# Patient Record
Sex: Female | Born: 1950 | Hispanic: No | Marital: Married | Smoking: Former smoker
Health system: Southern US, Community
[De-identification: ages and names within clinical notes are randomized; demographics above are authoritative.]

## PROBLEM LIST (undated history)

## (undated) DIAGNOSIS — K529 Noninfective gastroenteritis and colitis, unspecified: Secondary | ICD-10-CM

## (undated) DIAGNOSIS — Z8711 Personal history of peptic ulcer disease: Secondary | ICD-10-CM

## (undated) DIAGNOSIS — E538 Deficiency of other specified B group vitamins: Secondary | ICD-10-CM

## (undated) DIAGNOSIS — F329 Major depressive disorder, single episode, unspecified: Secondary | ICD-10-CM

## (undated) DIAGNOSIS — F3289 Other specified depressive episodes: Secondary | ICD-10-CM

## (undated) DIAGNOSIS — K5090 Crohn's disease, unspecified, without complications: Secondary | ICD-10-CM

## (undated) HISTORY — PX: MYOMECTOMY: SHX85

## (undated) HISTORY — PX: LASIK: SHX215

## (undated) HISTORY — DX: Deficiency of other specified B group vitamins: E53.8

## (undated) HISTORY — DX: Personal history of peptic ulcer disease: Z87.11

## (undated) HISTORY — DX: Other specified depressive episodes: F32.89

## (undated) HISTORY — DX: Major depressive disorder, single episode, unspecified: F32.9

## (undated) HISTORY — DX: Noninfective gastroenteritis and colitis, unspecified: K52.9

## (undated) HISTORY — DX: Crohn's disease, unspecified, without complications: K50.90

## (undated) HISTORY — PX: COLONOSCOPY: SHX174

## (undated) HISTORY — PX: ABDOMINAL HYSTERECTOMY: SHX81

## (undated) HISTORY — PX: RETINAL DETACHMENT SURGERY: SHX105

---

## 2008-08-30 ENCOUNTER — Ambulatory Visit: Payer: Self-pay | Admitting: Gastroenterology

## 2008-09-11 ENCOUNTER — Encounter: Payer: Self-pay | Admitting: Gastroenterology

## 2008-09-11 ENCOUNTER — Ambulatory Visit: Payer: Self-pay | Admitting: Gastroenterology

## 2008-09-13 ENCOUNTER — Encounter: Payer: Self-pay | Admitting: Gastroenterology

## 2008-09-14 ENCOUNTER — Ambulatory Visit: Payer: Self-pay | Admitting: Gastroenterology

## 2008-09-14 DIAGNOSIS — F329 Major depressive disorder, single episode, unspecified: Secondary | ICD-10-CM

## 2008-09-14 DIAGNOSIS — Z8711 Personal history of peptic ulcer disease: Secondary | ICD-10-CM | POA: Insufficient documentation

## 2008-09-20 ENCOUNTER — Ambulatory Visit: Payer: Self-pay | Admitting: Gastroenterology

## 2008-09-20 ENCOUNTER — Telehealth: Payer: Self-pay | Admitting: Gastroenterology

## 2008-09-20 DIAGNOSIS — E538 Deficiency of other specified B group vitamins: Secondary | ICD-10-CM | POA: Insufficient documentation

## 2008-09-20 LAB — CONVERTED CEMR LAB
AST: 18 units/L (ref 0–37)
BUN: 13 mg/dL (ref 6–23)
Basophils Relative: 0.1 % (ref 0.0–3.0)
Creatinine, Ser: 0.7 mg/dL (ref 0.4–1.2)
Eosinophils Absolute: 0.2 10*3/uL (ref 0.0–0.7)
Ferritin: 74.8 ng/mL (ref 10.0–291.0)
Folate: 8.8 ng/mL
GFR calc non Af Amer: 91.68 mL/min (ref >60)
Iron: 147 ug/dL — ABNORMAL HIGH (ref 42–145)
MCHC: 34.3 g/dL (ref 30.0–36.0)
MCV: 98 fL (ref 78.0–100.0)
Monocytes Absolute: 0.4 10*3/uL (ref 0.1–1.0)
Neutrophils Relative %: 71 % (ref 43.0–77.0)
Platelets: 206 10*3/uL (ref 150.0–400.0)
Potassium: 4.1 meq/L (ref 3.5–5.1)
RBC: 4.41 M/uL (ref 3.87–5.11)
TSH: 1.58 microintl units/mL (ref 0.35–5.50)
Total Bilirubin: 0.9 mg/dL (ref 0.3–1.2)
Vitamin B-12: 242 pg/mL (ref 211–911)

## 2008-09-28 ENCOUNTER — Ambulatory Visit: Payer: Self-pay | Admitting: Gastroenterology

## 2008-09-28 DIAGNOSIS — F17200 Nicotine dependence, unspecified, uncomplicated: Secondary | ICD-10-CM

## 2008-09-29 ENCOUNTER — Telehealth: Payer: Self-pay | Admitting: Gastroenterology

## 2008-10-05 ENCOUNTER — Ambulatory Visit: Payer: Self-pay | Admitting: Gastroenterology

## 2008-10-09 ENCOUNTER — Telehealth: Payer: Self-pay | Admitting: Gastroenterology

## 2008-11-16 ENCOUNTER — Telehealth: Payer: Self-pay | Admitting: Gastroenterology

## 2009-01-02 ENCOUNTER — Ambulatory Visit: Payer: Self-pay | Admitting: Gastroenterology

## 2009-01-31 ENCOUNTER — Encounter: Payer: Self-pay | Admitting: Gastroenterology

## 2009-02-14 ENCOUNTER — Encounter (INDEPENDENT_AMBULATORY_CARE_PROVIDER_SITE_OTHER): Payer: Self-pay

## 2009-04-13 ENCOUNTER — Ambulatory Visit: Payer: Self-pay | Admitting: Gastroenterology

## 2009-07-06 ENCOUNTER — Telehealth: Payer: Self-pay | Admitting: Gastroenterology

## 2009-07-24 ENCOUNTER — Encounter: Payer: Self-pay | Admitting: Gastroenterology

## 2009-08-02 ENCOUNTER — Encounter (INDEPENDENT_AMBULATORY_CARE_PROVIDER_SITE_OTHER): Payer: Self-pay | Admitting: *Deleted

## 2010-06-25 NOTE — Progress Notes (Signed)
Summary: Nascobal discontinued  Phone Note Call from Patient Call back at [facility] Phone [phone]   Call For: Dr Jarold Motto Summary of Call: Nascobal B12 nasal spray has been discontinued. [facility] on [address] told her they have been calling us and we do not reply. Initial call taken by: Leanor Kail [facility],  July 06, 2009 12:21 PM  Follow-up for Phone Call        Sample given. Lupita Leash Surface RN  July 06, 2009 2:17 PM  Checked with drug rep for nascobal and med has not been discontinued.  Spoke with pharmacist at [facility], they will try ordering med again.  Pt notified. Follow-up by: Ashok Cordia RN,  July 06, 2009 3:55 PM

## 2010-06-25 NOTE — Letter (Signed)
Summary: Colonoscopy Letter  [facility]  [address] [place], [place] [address]   Phone: [phone]  Fax: [phone]      August 02, 2009 MRN: [id number]   [facility] [address] [place], [place]  [address]   Dear Ms. Rupert,   According to your medical record, it is time for you to schedule a Colonoscopy. The American Cancer Society recommends this procedure as a method to detect early colon cancer. Patients with a family history of colon cancer, or a personal history of colon polyps or inflammatory bowel disease are at increased risk.  This letter has beeen generated based on the recommendations made at the time of your procedure. If you feel that in your particular situation this may no longer apply, please contact our office.  Please call our office at [phone] to schedule this appointment or to update your records at your earliest convenience.  Thank you for cooperating with [place] to provide you with the very best care possible.   Sincerely,   Vania Rea. Jarold Motto, M.D.  [facility] [phone]

## 2010-06-25 NOTE — Procedures (Signed)
Summary: Recall Assessment/[facility]  Recall Assessment/[facility]   Imported By: Sherian Rein 01/08/2010 10:10:16  _____________________________________________________________________  External Attachment:    Type:   Image     Comment:   External Document

## 2010-08-25 ENCOUNTER — Other Ambulatory Visit: Payer: Self-pay | Admitting: Gastroenterology

## 2010-09-04 ENCOUNTER — Other Ambulatory Visit: Payer: Self-pay | Admitting: Gastroenterology

## 2010-09-06 ENCOUNTER — Other Ambulatory Visit: Payer: Self-pay | Admitting: Gastroenterology

## 2010-09-09 ENCOUNTER — Telehealth: Payer: Self-pay | Admitting: Gastroenterology

## 2010-09-09 NOTE — Telephone Encounter (Signed)
Pt with Hx of Crohn's, last COLON 4/19/201 with findings of: FINDINGS:  There were mucosal changes consistent with Crohn's disease. in the mid transverse colon. SEVERE ULCERATION IN CECUM AND TV COLON.THE ASCENDING COLON HAS SCARRED ATROPHIC MUCOSA. LARGE GROUPS OF FUNGATING PSEUDOPOLYPS IN TV COLON. THERE IS MILD FOCAL SIGMOID DISEASE.I COULD NOT ENTER THE ILEUM VERY FAR PER INFLAMMATION IN THIS AREA.  There were mucosal changes consistent with Crohn's disease. in the sigmoid colon.  There were mucosal changes consistent with Crohn's disease. in the ascending colon.  There were mucosal changes consistent with Crohn's disease. in the cecum.   Retroflexed views in the rectum revealed no abnormalities.    The scope was then withdrawn from the patient and the procedure completed.  Last OV 06/13/2008. Recall letter sent 07/24/2009 and meds were refilled. Pt asked for refill on Wellbutrin and B12 nasal spray on [place] denied them. Pt called  back today stating she never has any problems, has a normal stool everyday and is asymptomatic. She stated if she has a COLON, the prep messes her up for more than 2 weeks.  Pt wants to know if she has to have a COLON now or can she wait until the winter?

## 2010-09-09 NOTE — Telephone Encounter (Signed)
NEEDS PV

## 2010-09-13 ENCOUNTER — Telehealth: Payer: Self-pay | Admitting: Gastroenterology

## 2010-09-13 MED ORDER — CYANOCOBALAMIN 500 MCG/0.1ML NA SOLN: NASAL | Status: DC

## 2010-09-13 MED ORDER — BUPROPION HCL ER (XL) 150 MG PO TB24: 150.0000 mg | ORAL_TABLET | ORAL | Status: DC

## 2010-09-13 MED ORDER — SULFASALAZINE 500 MG PO TABS: 500.0000 mg | ORAL_TABLET | Freq: Every day | ORAL | Status: DC

## 2010-09-13 NOTE — Telephone Encounter (Signed)
Patient made appt for colonoscopy and I explained that she must have colonoscopy to have futher refills. I have refilled her meds for one month if she does not keep the colonoscopy she will no longer get refills.

## 2010-09-24 ENCOUNTER — Ambulatory Visit (AMBULATORY_SURGERY_CENTER): Payer: Managed Care, Other (non HMO) | Admitting: *Deleted

## 2010-09-24 VITALS — Ht 64.0 in | Wt 130.0 lb

## 2010-09-24 DIAGNOSIS — Z1211 Encounter for screening for malignant neoplasm of colon: Secondary | ICD-10-CM

## 2010-09-24 MED ORDER — PEG-KCL-NACL-NASULF-NA ASC-C 100 G PO SOLR: ORAL | Status: DC

## 2010-09-30 ENCOUNTER — Other Ambulatory Visit: Payer: Self-pay | Admitting: Gastroenterology

## 2010-10-01 ENCOUNTER — Encounter: Payer: Self-pay | Admitting: Gastroenterology

## 2010-10-02 ENCOUNTER — Ambulatory Visit (AMBULATORY_SURGERY_CENTER): Payer: Managed Care, Other (non HMO) | Admitting: Gastroenterology

## 2010-10-02 ENCOUNTER — Encounter: Payer: Self-pay | Admitting: Gastroenterology

## 2010-10-02 VITALS — BP 152/81 | HR 68 | Temp 97.2°F | Resp 16 | Ht 64.0 in | Wt 130.0 lb

## 2010-10-02 DIAGNOSIS — K5010 Crohn's disease of large intestine without complications: Secondary | ICD-10-CM

## 2010-10-02 DIAGNOSIS — D126 Benign neoplasm of colon, unspecified: Secondary | ICD-10-CM

## 2010-10-02 DIAGNOSIS — Z1211 Encounter for screening for malignant neoplasm of colon: Secondary | ICD-10-CM

## 2010-10-02 DIAGNOSIS — K5090 Crohn's disease, unspecified, without complications: Secondary | ICD-10-CM

## 2010-10-02 MED ORDER — SODIUM CHLORIDE 0.9 % IV SOLN: 500.0000 mL | INTRAVENOUS | Status: DC

## 2010-10-02 MED ORDER — SULFASALAZINE 500 MG PO TABS: 1000.0000 mg | ORAL_TABLET | Freq: Two times a day (BID) | ORAL | Status: DC

## 2010-10-02 NOTE — Patient Instructions (Signed)
Read over discharge instructions. Colonoscopy shower colitis,pseudopolyps,& anal papillae. Biopsies taken, you will receive result letter in your mail in about 1-2 weeks. Repeat colonoscopy in 3 years. Resume current medications, [facility] and pick [facility] new prescriptions for Aza take 1gm twice a day.

## 2010-10-03 ENCOUNTER — Other Ambulatory Visit: Payer: Self-pay | Admitting: Gastroenterology

## 2010-10-03 ENCOUNTER — Telehealth: Payer: Self-pay | Admitting: *Deleted

## 2010-10-03 NOTE — Telephone Encounter (Signed)
Left message

## 2010-10-08 ENCOUNTER — Ambulatory Visit (INDEPENDENT_AMBULATORY_CARE_PROVIDER_SITE_OTHER): Payer: Managed Care, Other (non HMO) | Admitting: Gastroenterology

## 2010-10-08 ENCOUNTER — Encounter: Payer: Self-pay | Admitting: Gastroenterology

## 2010-10-08 ENCOUNTER — Other Ambulatory Visit (INDEPENDENT_AMBULATORY_CARE_PROVIDER_SITE_OTHER): Payer: Managed Care, Other (non HMO)

## 2010-10-08 VITALS — BP 144/92 | HR 84 | Ht 64.0 in | Wt 128.0 lb

## 2010-10-08 DIAGNOSIS — K529 Noninfective gastroenteritis and colitis, unspecified: Secondary | ICD-10-CM

## 2010-10-08 DIAGNOSIS — K5289 Other specified noninfective gastroenteritis and colitis: Secondary | ICD-10-CM

## 2010-10-08 DIAGNOSIS — K5010 Crohn's disease of large intestine without complications: Secondary | ICD-10-CM

## 2010-10-08 LAB — HEPATIC FUNCTION PANEL
ALT: 21 U/L (ref 0–35)
AST: 18 U/L (ref 0–37)
Albumin: 4.1 g/dL (ref 3.5–5.2)
Alkaline Phosphatase: 62 U/L (ref 39–117)
Bilirubin, Direct: 0.1 mg/dL (ref 0.0–0.3)
Total Protein: 6.9 g/dL (ref 6.0–8.3)

## 2010-10-08 LAB — BASIC METABOLIC PANEL
BUN: 19 mg/dL (ref 6–23)
CO2: 27 mEq/L (ref 19–32)
Chloride: 102 mEq/L (ref 96–112)
Glucose, Bld: 76 mg/dL (ref 70–99)
Potassium: 5.1 mEq/L (ref 3.5–5.1)

## 2010-10-08 LAB — CBC WITH DIFFERENTIAL/PLATELET
Basophils Absolute: 0 10*3/uL (ref 0.0–0.1)
Eosinophils Absolute: 0.3 10*3/uL (ref 0.0–0.7)
Lymphocytes Relative: 23.5 % (ref 12.0–46.0)
MCHC: 34.2 g/dL (ref 30.0–36.0)
Monocytes Relative: 8.7 % (ref 3.0–12.0)
Platelets: 228 10*3/uL (ref 150.0–400.0)
RDW: 13.7 % (ref 11.5–14.6)

## 2010-10-08 LAB — IBC PANEL
Saturation Ratios: 41.5 % (ref 20.0–50.0)
Transferrin: 252.8 mg/dL (ref 212.0–360.0)

## 2010-10-08 LAB — FOLATE: Folate: >24.9 ng/mL (ref >5.9)

## 2010-10-08 LAB — SEDIMENTATION RATE: Sed Rate: 10 mm/hr (ref 0–22)

## 2010-10-08 MED ORDER — CENTRUM SILVER PO TABS: 1.0000 | ORAL_TABLET | Freq: Every day | ORAL | Status: DC

## 2010-10-08 MED ORDER — CALCIUM CARBONATE-VITAMIN D 600-400 MG-UNIT PO TABS: 1.0000 | ORAL_TABLET | Freq: Two times a day (BID) | ORAL | Status: DC

## 2010-10-08 NOTE — Patient Instructions (Signed)
Buy Caltrate and Centrum Silver and take as directed on your medication list.  Please go to the basement today for your labs.

## 2010-10-08 NOTE — Progress Notes (Signed)
History of Present Illness: This is a 60 year old patient female with Crohn's colitis who recently underwent dysplasia screening. Marked colonic pseudopolyposis with scattered erosions. Biopsy showed no evidence of dysplasia. She denies any GI or general medical symptoms. Her medications have included Azulfidine 1 g twice a day for several years. He is not had recent laboratory testing. She is Wellbutrin 50 mg a day. She denies a current psychiatric difficulties.    Current Medications, Allergies, Past Medical History, Past Surgical History, Family History and Social History were reviewed in [employer] record.  PE: Awake and alert no acute distress. She does have some scattered spider telangiectasias on her anterior thorax. Chest is clear cardiac exam is unremarkable. I cannot appreciate hepatosplenomegaly, abdominal masses or tenderness. Bowel sounds are normal. There no gross focal neurological deficits.  Assessment and plan: Chronic Crohn's colitis rarely stable on Azulfidine 1 g twice a day with folic acid supplementation. I'm concerned that she may have occult liver disease and have scheduled screening labs for review. I've suggested she take Centrum Silver ultra vitamins, continue folic acid, and also take Caltrate 600 with vitamin D 2 tablets a day. She will need screening colonoscopy every 3 years depending on her clinical course. Encounter Diagnosis  Name Primary?  . Colitis Yes

## 2010-10-09 ENCOUNTER — Encounter: Payer: Self-pay | Admitting: Gastroenterology

## 2010-11-02 ENCOUNTER — Other Ambulatory Visit: Payer: Self-pay | Admitting: Gastroenterology

## 2010-11-10 ENCOUNTER — Other Ambulatory Visit: Payer: Self-pay | Admitting: Gastroenterology

## 2010-11-19 ENCOUNTER — Other Ambulatory Visit: Payer: Self-pay | Admitting: Gastroenterology

## 2010-12-09 ENCOUNTER — Other Ambulatory Visit: Payer: Self-pay | Admitting: Gastroenterology

## 2010-12-11 ENCOUNTER — Other Ambulatory Visit: Payer: Self-pay | Admitting: Gastroenterology

## 2010-12-29 ENCOUNTER — Other Ambulatory Visit: Payer: Self-pay | Admitting: Gastroenterology

## 2011-02-11 ENCOUNTER — Telehealth: Payer: Self-pay | Admitting: Gastroenterology

## 2011-02-11 NOTE — Telephone Encounter (Signed)
Coupon mailed to pt and if it will not take enough off She will call back to schedule b12 injections monthly.

## 2011-05-05 ENCOUNTER — Encounter (HOSPITAL_COMMUNITY): Payer: Self-pay | Admitting: *Deleted

## 2011-05-05 ENCOUNTER — Emergency Department (HOSPITAL_COMMUNITY): Payer: BC Managed Care – PPO

## 2011-05-05 ENCOUNTER — Emergency Department (HOSPITAL_COMMUNITY)
Admission: EM | Admit: 2011-05-05 | Discharge: 2011-05-05 | Disposition: A | Discharge status: Home / Self Care | Payer: BC Managed Care – PPO | Attending: Emergency Medicine | Admitting: Emergency Medicine

## 2011-05-05 DIAGNOSIS — S93409A Sprain of unspecified ligament of unspecified ankle, initial encounter: Secondary | ICD-10-CM | POA: Insufficient documentation

## 2011-05-05 DIAGNOSIS — X58XXXA Exposure to other specified factors, initial encounter: Secondary | ICD-10-CM | POA: Insufficient documentation

## 2011-05-05 DIAGNOSIS — M25579 Pain in unspecified ankle and joints of unspecified foot: Secondary | ICD-10-CM | POA: Insufficient documentation

## 2011-05-05 MED ORDER — OXYCODONE-ACETAMINOPHEN 5-325 MG PO TABS
1.0000 | ORAL_TABLET | Freq: Once | ORAL | Status: AC
Start: 2011-05-05 — End: 2011-05-05
  Administered 2011-05-05: 1 via ORAL
  Filled 2011-05-05: qty 1

## 2011-05-05 MED ORDER — OXYCODONE-ACETAMINOPHEN 5-325 MG PO TABS: 1.0000 | ORAL_TABLET | Freq: Once | ORAL | Status: AC

## 2011-05-05 NOTE — ED Notes (Signed)
Patient transported to X-ray 

## 2011-05-05 NOTE — ED Provider Notes (Signed)
History     CSN: [id number] Arrival date & time: 05/05/2011 11:55 AM   First MD Initiated Contact with Patient 05/05/11 1303      Chief Complaint  Patient presents with  . Ankle Pain    pt does not recall exact injury. pt reports waking this am and not able to walk. pts left ankle is swollen and bruised.     (Consider location/radiation/quality/duration/timing/severity/associated sxs/prior treatment) HPI Comments: Patient presents with chief complaint of left foot and ankle pain when weightbearing.  She does not recall injuring her foot but states she might have rolled her ankle last night while walking downstairs.  Patient denies any other complaints.  Patient is a 60 y.o. female presenting with ankle pain. The history is provided by the patient.  Ankle Pain  The incident occurred yesterday. The incident occurred at home. The pain is present in the left foot and left ankle. The quality of the pain is described as aching. The pain is at a severity of 7/10. The pain is mild. Associated symptoms include inability to bear weight. Pertinent negatives include no numbness, no loss of motion, no muscle weakness, no loss of sensation and no tingling. She reports no foreign bodies present. The symptoms are aggravated by bearing weight and palpation. She has tried acetaminophen, ice and elevation for the symptoms. The treatment provided mild relief.    Past Medical History  Diagnosis Date  . Crohn disease   . Depressive disorder, not elsewhere classified   . Personal history of peptic ulcer disease   . Vitamin B12 deficiency   . Colitis     Past Surgical History  Procedure Date  . Myomectomy   . Cesarean section     x2  . Abdominal hysterectomy     Family History  Problem Relation Age of Onset  . Heart disease Father   . Colon cancer Neg Hx     History  Substance Use Topics  . Smoking status: Current Everyday Smoker -- 0.5 packs/day    Types: Cigarettes  . Smokeless tobacco:  Not on file  . Alcohol Use: No     2- 6 ounce glasses wine daily    OB History    Grav Para Term Preterm Abortions TAB SAB Ect Mult Living                  Review of Systems  Neurological: Negative for tingling and numbness.  All other systems reviewed and are negative.    Allergies  Erythromycin  Home Medications   Current Outpatient Rx  Name Route Sig Dispense Refill  . BUPROPION HCL ER (XL) 150 MG PO TB24 Oral Take 150 mg by mouth daily.     Marland Kitchen BUTALBITAL-APAP-CAFFEINE 50-325-40 MG PO TABS Oral Take 0.5 tablets by mouth 2 (two) times daily as needed. For headache     . CALCIUM CARBONATE-VITAMIN D 600-400 MG-UNIT PO TABS Oral Take 1 tablet by mouth daily.      . CYANOCOBALAMIN 500 MCG/0.1ML NA SOLN Nasal Place 500 mcg into the nose once a week.     Marland Kitchen PEPCID PO Oral Take 20 mg by mouth daily.     Marland Kitchen FEXOFENADINE HCL 60 MG PO TABS Oral Take 60 mg by mouth daily.      Marland Kitchen FLUOXETINE HCL 20 MG PO CAPS Oral Take 20 mg by mouth daily.      Marland Kitchen FOLIC ACID 1 MG PO TABS Oral Take 1 mg by mouth daily.     Marland Kitchen  GUAIFENESIN ER 600 MG PO TB12 Oral Take 600 mg by mouth daily.      . IBUPROFEN 200 MG PO TABS Oral Take 200 mg by mouth every 6 (six) hours as needed. For headache     . CENTRUM SILVER PO TABS Oral Take 1 tablet by mouth daily.     . SULFASALAZINE 500 MG PO TABS Oral Take 1,000 mg by mouth daily.     Marland Kitchen VARENICLINE TARTRATE 0.5 MG X 11 & 1 MG X 42 PO MISC Oral Take 1 mg by mouth 2 (two) times daily. Take one 0.5mg  tablet by mouth once daily for 3 days, then increase to one 0.5mg  tablet twice daily for 3 days, then increase to one 1mg  tablet twice daily.       BP 141/89  Pulse 82  Temp 98 F (36.7 C)  Resp 20  SpO2 97%  Physical Exam  Constitutional: She is oriented to person, place, and time. She appears well-developed and well-nourished. No distress.  HENT:  Head: Normocephalic and atraumatic.  Mouth/Throat: Oropharynx is clear and moist. No oropharyngeal exudate.  Eyes:  Conjunctivae and EOM are normal. Pupils are equal, round, and reactive to light. No scleral icterus.  Neck: Normal range of motion. Neck supple. No tracheal deviation present. No thyromegaly present.  Cardiovascular: Normal rate, regular rhythm, normal heart sounds and intact distal pulses.   Pulmonary/Chest: Effort normal and breath sounds normal. No stridor.  Abdominal: Soft. Bowel sounds are normal.  Musculoskeletal: She exhibits no edema and no tenderness.       Left ankle: She exhibits decreased range of motion and swelling. She exhibits no deformity, no laceration and normal pulse. tenderness. Lateral malleolus and medial malleolus tenderness found. No head of 5th metatarsal and no proximal fibula tenderness found.       Left foot: She exhibits tenderness, bony tenderness and swelling. She exhibits normal range of motion, normal capillary refill and no laceration.  Neurological: She is alert and oriented to person, place, and time. She has normal reflexes. Coordination normal.  Skin: Skin is warm and dry. No rash noted. She is not diaphoretic. No erythema. No pallor.  Psychiatric: She has a normal mood and affect. Her behavior is normal.    ED Course  Procedures (including critical care time)  Labs Reviewed - No data to display Dg Ankle Complete Left  05/05/2011  *RADIOLOGY REPORT*  Clinical Data: Left ankle injury.  LEFT ANKLE COMPLETE - 3+ VIEW  Comparison: None  Findings: The ankle mortise is maintained.  No acute ankle fracture.  There is moderate lateral soft tissue swelling.  IMPRESSION: No acute fracture.  Original Report Authenticated By: P. Loralie Champagne, M.D.   Dg Foot Complete Left  05/05/2011  *RADIOLOGY REPORT*  Clinical Data: Left foot pain.  LEFT FOOT - COMPLETE 3+ VIEW  Comparison: None  Findings: The joint spaces are maintained.  No acute fracture.  IMPRESSION: No acute bony findings.  Original Report Authenticated By: P. Loralie Champagne, M.D.     No diagnosis  found.    MDM  Ankle pain/swellin left        [place], [place] 05/05/11 1445

## 2011-05-05 NOTE — ED Notes (Signed)
Family at bedside. 

## 2011-05-05 NOTE — ED Notes (Signed)
Pt reports a "turned ankle" 2 days ago. Pt unable to place weight on it.

## 2011-05-06 NOTE — ED Provider Notes (Signed)
Medical screening examination/treatment/procedure(s) were conducted as a shared visit with non-physician practitioner(s) and myself.  I personally evaluated the patient during the encounter   Goedde Munch, MD 05/06/11 [phone]

## 2011-06-07 ENCOUNTER — Other Ambulatory Visit: Payer: Self-pay | Admitting: Gastroenterology

## 2011-07-02 ENCOUNTER — Other Ambulatory Visit: Payer: Self-pay | Admitting: Gastroenterology

## 2011-10-20 ENCOUNTER — Other Ambulatory Visit: Payer: Self-pay | Admitting: Gastroenterology

## 2011-12-06 ENCOUNTER — Other Ambulatory Visit: Payer: Self-pay | Admitting: Gastroenterology

## 2011-12-08 ENCOUNTER — Other Ambulatory Visit: Payer: Self-pay | Admitting: Gastroenterology

## 2011-12-08 MED ORDER — BUPROPION HCL ER (XL) 150 MG PO TB24: 150.0000 mg | ORAL_TABLET | Freq: Every day | ORAL | Status: DC

## 2011-12-22 ENCOUNTER — Other Ambulatory Visit: Payer: Self-pay | Admitting: Gastroenterology

## 2012-01-21 ENCOUNTER — Other Ambulatory Visit: Payer: Self-pay | Admitting: Gastroenterology

## 2012-02-28 ENCOUNTER — Other Ambulatory Visit: Payer: Self-pay | Admitting: Gastroenterology

## 2012-03-01 ENCOUNTER — Other Ambulatory Visit: Payer: Self-pay | Admitting: Gastroenterology

## 2012-04-13 ENCOUNTER — Other Ambulatory Visit: Payer: Self-pay | Admitting: Gastroenterology

## 2012-04-13 ENCOUNTER — Telehealth: Payer: Self-pay | Admitting: *Deleted

## 2012-04-13 NOTE — Telephone Encounter (Signed)
buPROPion (WELLBUTRIN XL) 150 MG 24 hr tablet [Pharmacy Med Name: BUPROPION XL 150MG  TABLETS (24 H)]  30 tablet  11  12/06/2011     Sig: TAKE 1 TABLET BY MOUTH EVERY MORNING   Class: Normal   Reason for Refusal: Patient needs an appointment   Refused By: Richardson Chiquito, CMA                  Visit Pharmacy    Tried to call patient and schedule a follow up visit but patients vm said that there is no space to leave message Denied refill, patient needs office visit for Crohns.  Patient last OV was 10-08-2010

## 2012-04-15 ENCOUNTER — Telehealth: Payer: Self-pay | Admitting: Gastroenterology

## 2012-04-16 ENCOUNTER — Other Ambulatory Visit: Payer: Self-pay | Admitting: *Deleted

## 2012-04-16 MED ORDER — BUPROPION HCL ER (XL) 150 MG PO TB24: 150.0000 mg | ORAL_TABLET | Freq: Every day | ORAL | Status: DC

## 2012-04-16 NOTE — Telephone Encounter (Signed)
I called patient left message on vm for patient to call me back.  Patient needs office visit for follow up

## 2012-04-16 NOTE — Telephone Encounter (Signed)
Patient called back and stated that she does not have enough Wellbutrin, only two days left and needs a refill.  I advised patient that she needs an office visit for further refills because she has IBD and Crohns and her last office visit was 09-2010.  Patient stated that she is on her way to [facility] and did not know that the RX was denied 3 times until this morning when she was leaving for [place].  Patient stated that she will be in [facility] until Dec 2. I advised patient that we need to schedule a follow up visit first and I will have to ask nursing supervisor if ok to send RX to cover her until follow up appointment.  Per Lavonna Rua, RN ok to give patient enough pills to last her until she comes in for a follow up office visit on Dec. 6.  Patient wanted RX called in to [facility]. On [address]  Advised patient that RX will be sent, enough will be sent to cover her until her follow up appointment on Dec 6 at 11 am and she will have to keep this appointment for further refills.   Patient verbalized understanding.

## 2012-04-30 ENCOUNTER — Ambulatory Visit (INDEPENDENT_AMBULATORY_CARE_PROVIDER_SITE_OTHER): Payer: BC Managed Care – PPO | Admitting: Gastroenterology

## 2012-04-30 ENCOUNTER — Encounter: Payer: Self-pay | Admitting: Gastroenterology

## 2012-04-30 VITALS — BP 110/76 | HR 80 | Ht 63.0 in | Wt 139.5 lb

## 2012-04-30 DIAGNOSIS — E538 Deficiency of other specified B group vitamins: Secondary | ICD-10-CM

## 2012-04-30 DIAGNOSIS — K5010 Crohn's disease of large intestine without complications: Secondary | ICD-10-CM

## 2012-04-30 MED ORDER — BUPROPION HCL ER (XL) 150 MG PO TB24: 150.0000 mg | ORAL_TABLET | Freq: Every day | ORAL | Status: DC

## 2012-04-30 MED ORDER — SULFASALAZINE 500 MG PO TABS: ORAL_TABLET | ORAL | Status: DC

## 2012-04-30 MED ORDER — CYANOCOBALAMIN 500 MCG/0.1ML NA SOLN: NASAL | Status: DC

## 2012-04-30 NOTE — Progress Notes (Signed)
This is a very pleasant 61 year old Caucasian female who has a long history of Crohn's disease of her colon.  Last colonoscopy in June of 2012 showed numerous colonic pseudopolyps, but biopsy showed no evidence of dysplasia.  She is on Azulfidine 1 g twice a day along with folic acid and multivitamins, and is currently asymptomatic.  With the help of Wellbutrin, she been able to discontinue her smoking habit.  Her appetite is good and her weight is stable.  She denies diarrhea, abdominal pain, rectal bleeding, upper GI, hepatobiliary, or systemic complaints.  Current Medications, Allergies, Past Medical History, Past Surgical History, Family History and Social History were reviewed in [employer] record.  Pertinent Review of Systems Negative   Physical Exam: Healthy patient in no acute distress.  Blood pressure 110/76, pulse 80 and regular, weight 139 pounds and BMI 24.71.  I cannot appreciate stigmata of chronic liver disease.  Chest is clear and she is in a regular rhythm without murmurs gallops or rubs.  Abdominal exam is entirely benign without organomegaly, masses or tenderness.  Bowel sounds are normal.  Rectal exam is deferred.  Peripheral extremities are unremarkable.    Assessment and Plan: Chronic Crohn's colitis doing well on standard aminosalicylate therapy.  She is to continue all of her medications as listed and reviewed.  We will do yearly followups with repeat labs in 1 year time.  She is on nasal B12 replacement therapy.  She receives colonoscopy with dysplasia screening every 3 years.  She is to call if she has recurrence or flare of her symptoms.  We will continue Wellbutrin therapy because were successful smoking cessation. No diagnosis found.

## 2012-04-30 NOTE — Patient Instructions (Addendum)
Please follow up in one year.  We have sent medications to your pharmacy for you to pick up at your convenience: Nascobal, Wellbutrin and Sulfasalazine. Please take as directed.  CC: Elpidio Anis, MD

## 2012-06-03 ENCOUNTER — Other Ambulatory Visit: Payer: Self-pay | Admitting: Gastroenterology

## 2012-08-01 ENCOUNTER — Other Ambulatory Visit: Payer: Self-pay | Admitting: Gastroenterology

## 2012-08-05 ENCOUNTER — Telehealth: Payer: Self-pay | Admitting: Gastroenterology

## 2012-08-05 DIAGNOSIS — Z9225 Personal history of immunosupression therapy: Secondary | ICD-10-CM

## 2012-08-05 NOTE — Telephone Encounter (Signed)
Patient is out of town.  She scheduled an appointment to see Dr. Jarold Motto 08/13/12 for blood in her stool.  She is wanting to know if it subsides, should she still keep the appointment.

## 2012-08-05 NOTE — Telephone Encounter (Signed)
Pt reports she is out of town and her bleeding has cleared up. Her appt was cancelled, but it's time for her labs so she will come in next week.

## 2012-08-05 NOTE — Telephone Encounter (Signed)
lmom for pt to call back or leave me a message. She does have Crohn's and needs yearly f/u's and labs per notes.

## 2012-08-12 ENCOUNTER — Other Ambulatory Visit (INDEPENDENT_AMBULATORY_CARE_PROVIDER_SITE_OTHER): Payer: BC Managed Care – PPO

## 2012-08-12 DIAGNOSIS — Z9225 Personal history of immunosupression therapy: Secondary | ICD-10-CM

## 2012-08-12 LAB — CBC WITH DIFFERENTIAL/PLATELET
Eosinophils Relative: 5.6 % — ABNORMAL HIGH (ref 0.0–5.0)
HCT: 40.2 % (ref 36.0–46.0)
Hemoglobin: 13.4 g/dL (ref 12.0–15.0)
Lymphs Abs: 1.5 10*3/uL (ref 0.7–4.0)
Monocytes Relative: 8.5 % (ref 3.0–12.0)
Neutro Abs: 2.9 10*3/uL (ref 1.4–7.7)
WBC: 5.1 10*3/uL (ref 4.5–10.5)

## 2012-08-12 LAB — BASIC METABOLIC PANEL
BUN: 21 mg/dL (ref 6–23)
Calcium: 9.1 mg/dL (ref 8.4–10.5)
Creatinine, Ser: 0.7 mg/dL (ref 0.4–1.2)
GFR: 86.18 mL/min (ref >60.00)
Potassium: 4.8 mEq/L (ref 3.5–5.1)

## 2012-08-12 LAB — TSH: TSH: 2.2 u[IU]/mL (ref 0.35–5.50)

## 2012-08-12 LAB — FOLATE: Folate: >24.8 ng/mL (ref >5.9)

## 2012-08-12 LAB — IBC PANEL: Saturation Ratios: 31.8 % (ref 20.0–50.0)

## 2012-08-12 LAB — SEDIMENTATION RATE: Sed Rate: 11 mm/hr (ref 0–22)

## 2012-08-12 LAB — HEPATIC FUNCTION PANEL: Albumin: 4.1 g/dL (ref 3.5–5.2)

## 2012-08-13 ENCOUNTER — Ambulatory Visit: Payer: BC Managed Care – PPO | Admitting: Gastroenterology

## 2012-08-19 ENCOUNTER — Telehealth: Payer: Self-pay | Admitting: Gastroenterology

## 2012-08-19 NOTE — Telephone Encounter (Signed)
Informed pt of lab results; mailed her a copy. Pt stated understanding.

## 2012-09-03 IMAGING — CR DG FOOT COMPLETE 3+V*L*
3 series · 3 of 3 positions shown · non-contrast
Comparison: None

CLINICAL DATA: Left foot pain.

LEFT FOOT - COMPLETE 3+ VIEW

[x foot ap left]
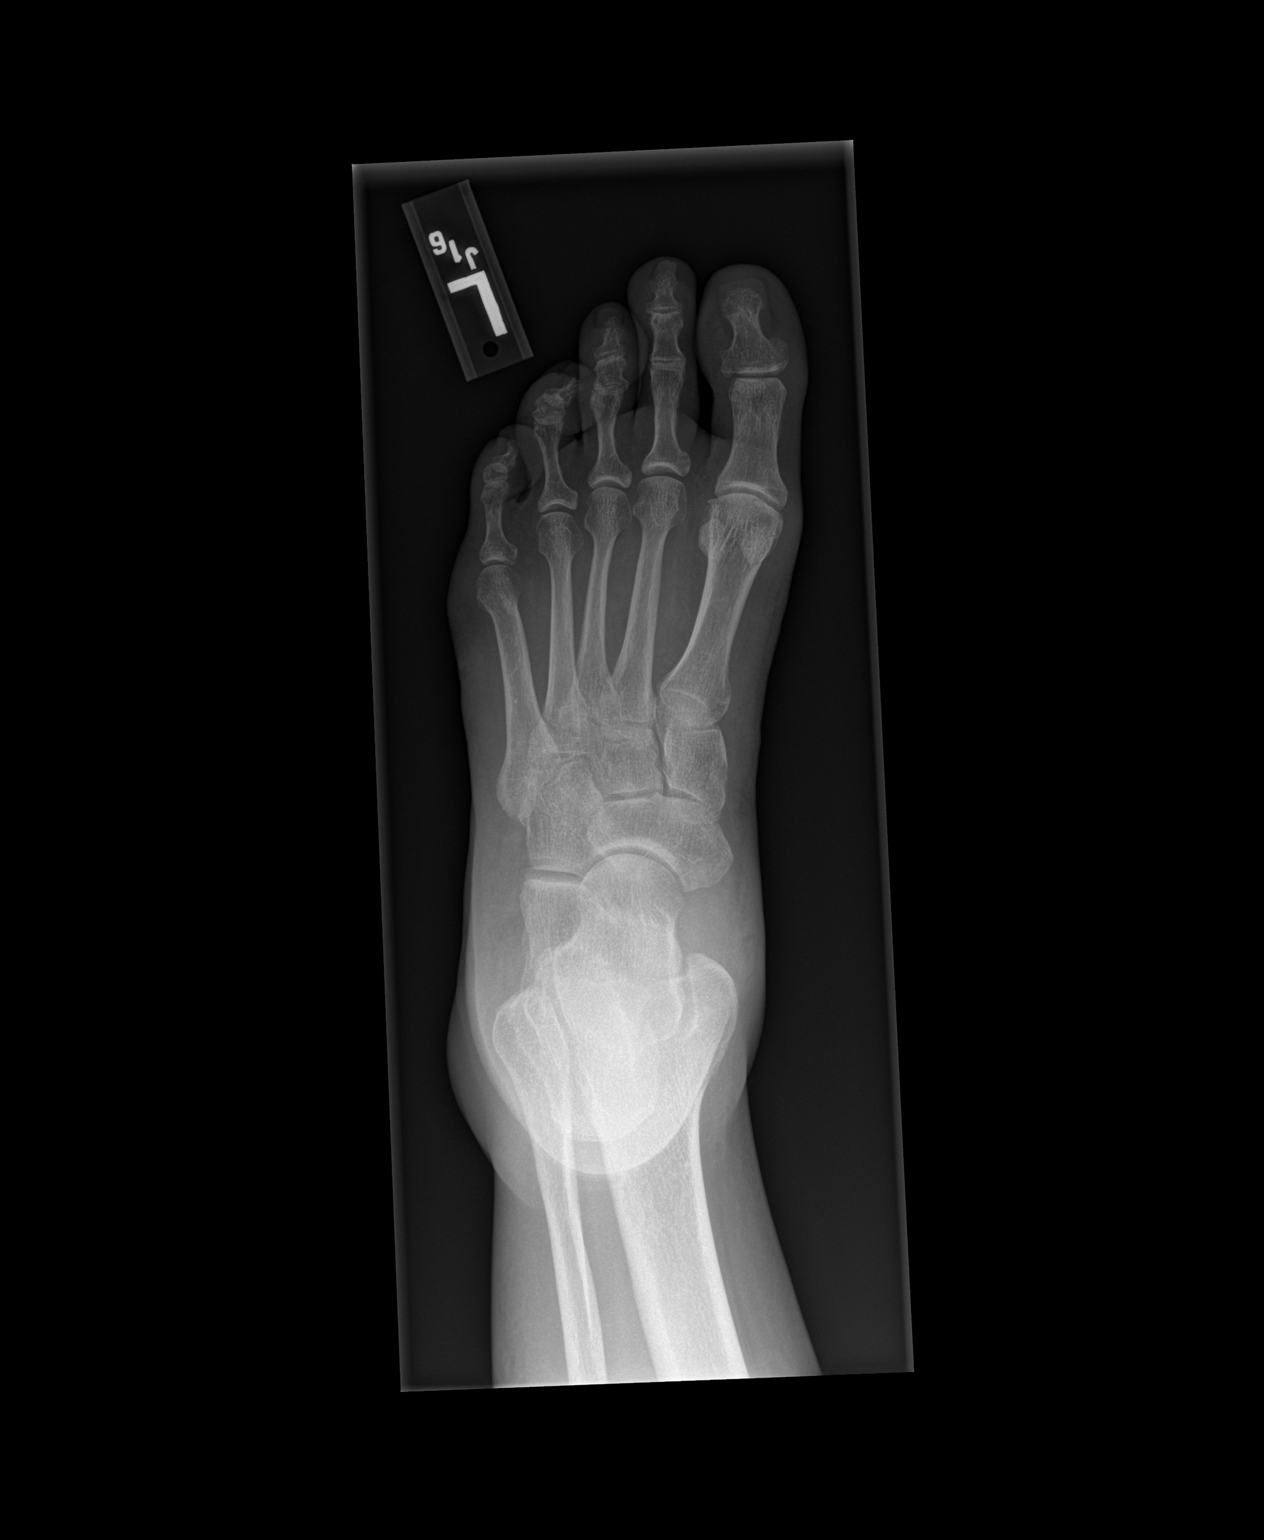

[x foot obl left]
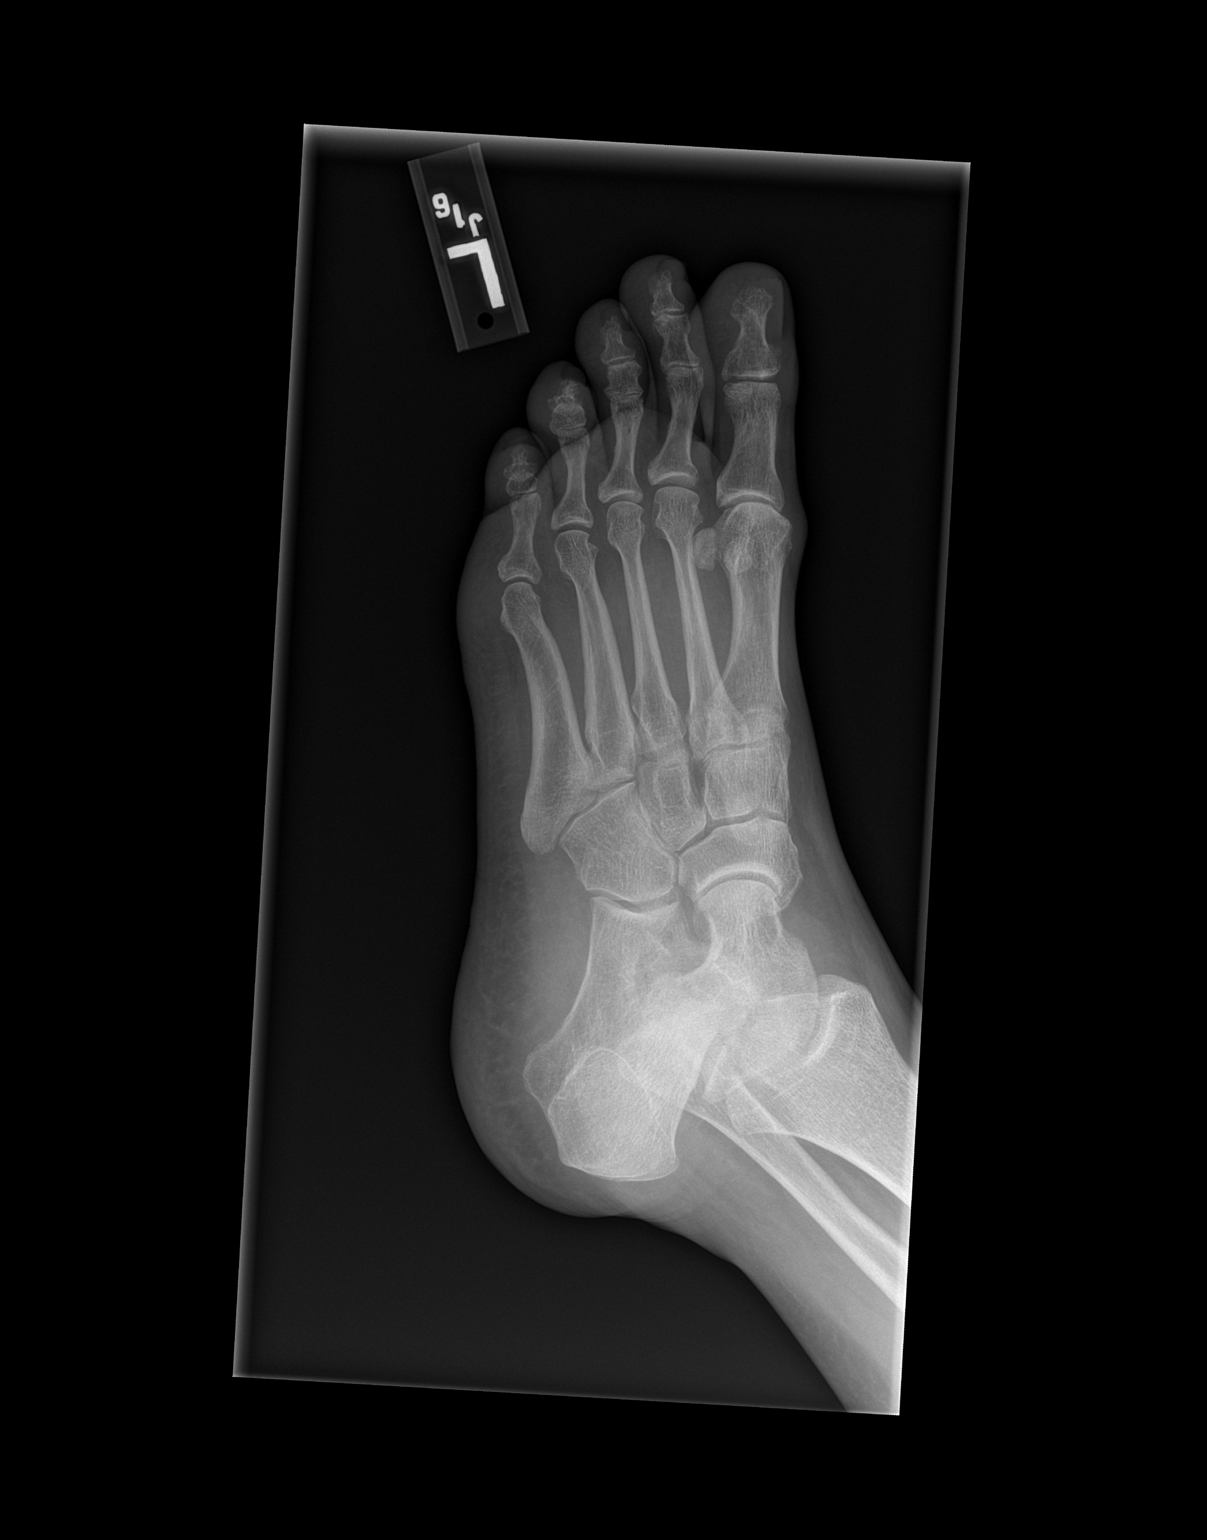

[x foot lat left]
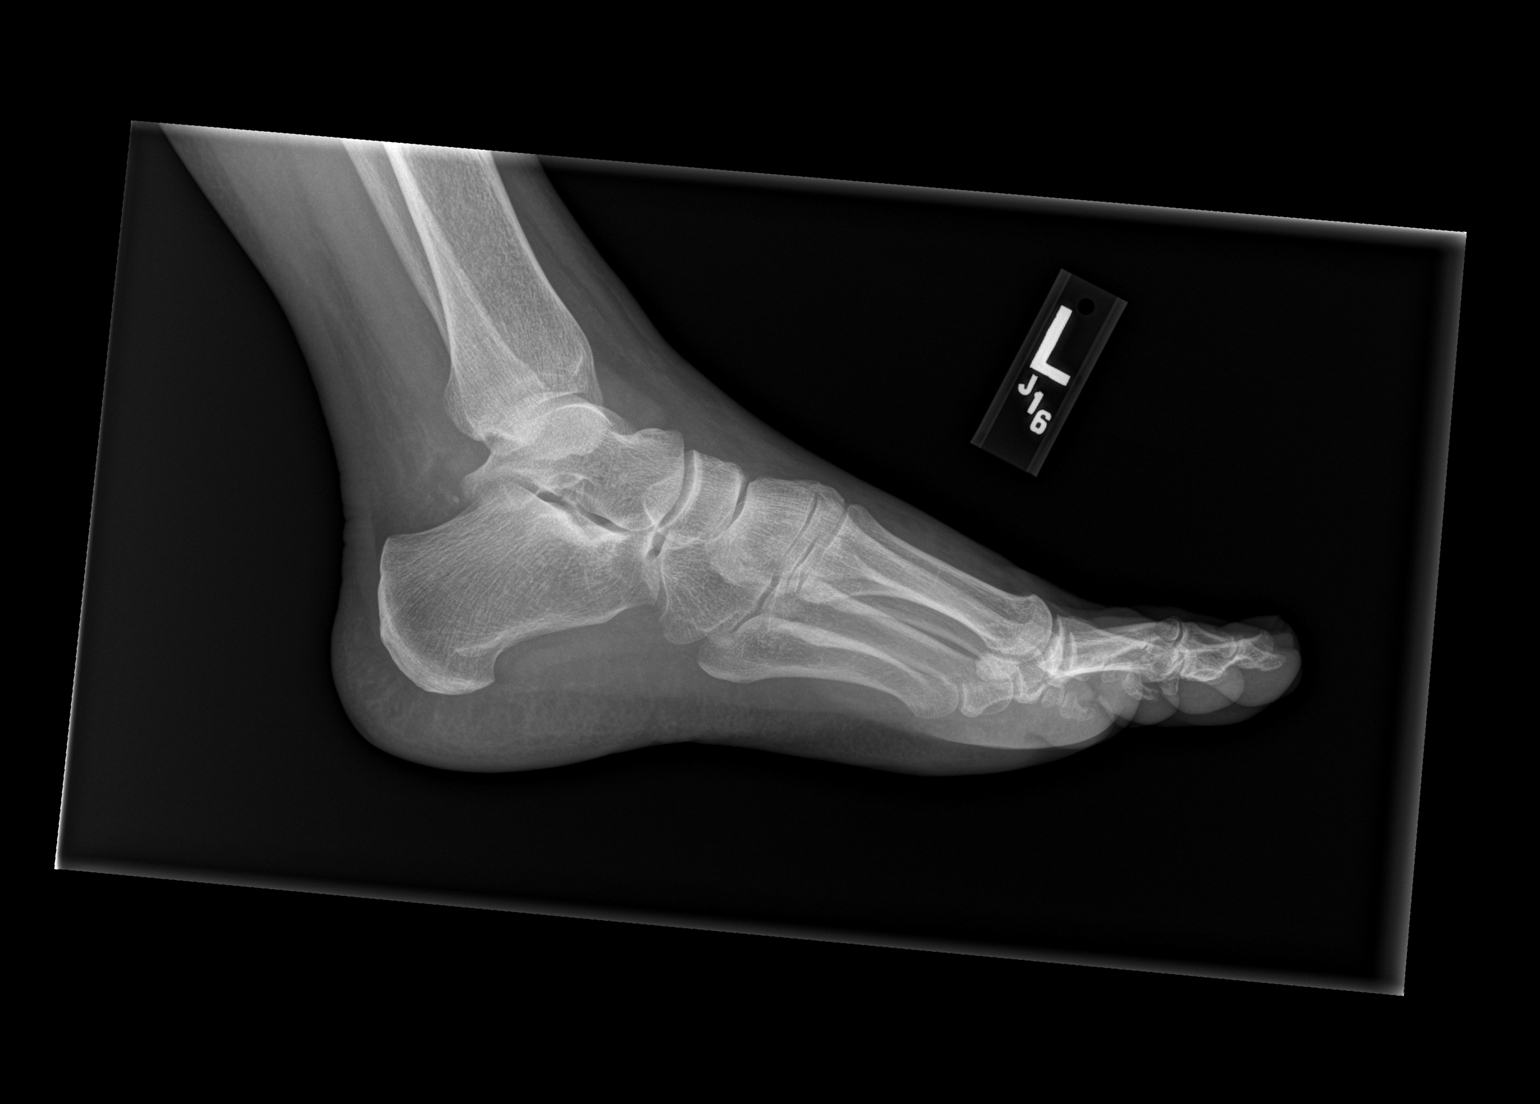

[3 of 3 positions shown; findings below may reference images not displayed]

FINDINGS: The joint spaces are maintained.  No acute fracture.
IMPRESSION: No acute bony findings.

## 2012-09-14 ENCOUNTER — Other Ambulatory Visit: Payer: Self-pay | Admitting: Gastroenterology

## 2012-09-30 ENCOUNTER — Other Ambulatory Visit (INDEPENDENT_AMBULATORY_CARE_PROVIDER_SITE_OTHER): Payer: BC Managed Care – PPO

## 2012-09-30 ENCOUNTER — Encounter: Payer: Self-pay | Admitting: Gastroenterology

## 2012-09-30 ENCOUNTER — Ambulatory Visit (INDEPENDENT_AMBULATORY_CARE_PROVIDER_SITE_OTHER): Payer: BC Managed Care – PPO | Admitting: Gastroenterology

## 2012-09-30 VITALS — BP 120/80 | HR 91 | Ht 63.0 in | Wt 143.4 lb

## 2012-09-30 DIAGNOSIS — R109 Unspecified abdominal pain: Secondary | ICD-10-CM

## 2012-09-30 DIAGNOSIS — K625 Hemorrhage of anus and rectum: Secondary | ICD-10-CM

## 2012-09-30 DIAGNOSIS — K921 Melena: Secondary | ICD-10-CM

## 2012-09-30 DIAGNOSIS — R197 Diarrhea, unspecified: Secondary | ICD-10-CM

## 2012-09-30 DIAGNOSIS — K5010 Crohn's disease of large intestine without complications: Secondary | ICD-10-CM

## 2012-09-30 LAB — HEPATIC FUNCTION PANEL
ALT: 24 U/L (ref 0–35)
Albumin: 4.1 g/dL (ref 3.5–5.2)
Total Bilirubin: 0.5 mg/dL (ref 0.3–1.2)

## 2012-09-30 LAB — CBC WITH DIFFERENTIAL/PLATELET
Eosinophils Absolute: 0.2 10*3/uL (ref 0.0–0.7)
Eosinophils Relative: 3.5 % (ref 0.0–5.0)
HCT: 40.7 % (ref 36.0–46.0)
Lymphs Abs: 1.2 10*3/uL (ref 0.7–4.0)
MCHC: 33.7 g/dL (ref 30.0–36.0)
MCV: 96.8 fl (ref 78.0–100.0)
Monocytes Absolute: 0.6 10*3/uL (ref 0.1–1.0)
Neutrophils Relative %: 63.4 % (ref 43.0–77.0)
Platelets: 253 10*3/uL (ref 150.0–400.0)
WBC: 5.5 10*3/uL (ref 4.5–10.5)

## 2012-09-30 LAB — IBC PANEL
Iron: 70 ug/dL (ref 42–145)
Transferrin: 243.8 mg/dL (ref 212.0–360.0)

## 2012-09-30 LAB — FERRITIN: Ferritin: 41.9 ng/mL (ref 10.0–291.0)

## 2012-09-30 LAB — COMPREHENSIVE METABOLIC PANEL
ALT: 24 U/L (ref 0–35)
Alkaline Phosphatase: 66 U/L (ref 39–117)
Glucose, Bld: 88 mg/dL (ref 70–99)
Sodium: 137 mEq/L (ref 135–145)
Total Bilirubin: 0.5 mg/dL (ref 0.3–1.2)
Total Protein: 7.3 g/dL (ref 6.0–8.3)

## 2012-09-30 LAB — C-REACTIVE PROTEIN: CRP: <0.5 mg/dL (ref 0.5–20.0)

## 2012-09-30 LAB — SEDIMENTATION RATE: Sed Rate: 13 mm/hr (ref 0–22)

## 2012-09-30 MED ORDER — PREDNISONE 20 MG PO TABS: 20.0000 mg | ORAL_TABLET | Freq: Every day | ORAL | Status: DC

## 2012-09-30 NOTE — Progress Notes (Signed)
This is a very pleasant 62 year old Caucasian female with chronic Crohn's disease of the colon.  Last colonoscopy in dysplasia screening was 2 years ago, and there is no microscopic evidence of dysplasia, but the patient had a scarred: With multiple pseudopolyps.  She now presents with 6-8 weeks of crampy mid and lower abdominal pain, watery diarrhea, and initially bloody diarrhea.  She has not been on recent antibiotics, but has had to decrease her dose of barbiturates she takes for chronic headaches.  Additionally the patient is on Prozac 20 mg a day and Topamax 50 mg at bedtime for chronic headaches.  Over the last week her symptoms have improved, but she continued with runny frequent stools and mild lower abdominal discomfort but no systemic or hepatobiliary or upper gastrointestinal complaints.  She did stop smoking one year ago.   Current Medications, Allergies, Past Medical History, Past Surgical History, Family History and Social History were reviewed in [employer] record.  ROS: All systems were reviewed and are negative unless otherwise stated in the HPI.          Physical Exam: Blood pressure 120/80, pulse 91 and regular and weight 143.  Cannot appreciate stigmata of chronic liver disease.  Her chest is clear and she is in a regular rhythm without murmurs gallops or rubs.  Her abdomen shows no distention, organomegaly, masses or localized tenderness.  Bowel sounds are active but nonobstructive.  Specks of rectum shows some external skin tags but no fissures or fistulae.  Rectal exam shows poor rectal tone with masses or tenderness.  There is soft been stool which is guaiac positive.  Peripheral extremities are unremarkable and mental status is normal.    Assessment and Plan: Flare of her IBD, rule out  C. difficile infection.  Labs are and also stool for C. difficile toxin assay.  I have scheduled flexible sigmoidoscopy early next week, have started prednisone 20 mg  a day, and we'll continue Azulfidine at current doses.  Previous attempts to treat her with higher doses of other amino salicylates have been unsuccessful because of side effects.  I do not think she is been previously on immunosuppressants or biological therapy, she relates that in the past she has been on corticosteroids intermittently.  Her flare of disease after stopping smoking certainly suggest possible chronic ulcerative colitis instead of Crohn's disease.  She denies abuse of NSAIDs or salicylates. No diagnosis found.

## 2012-09-30 NOTE — Patient Instructions (Addendum)
You have been scheduled for a flexible sigmoidoscopy. Please follow the written instructions given to you at your visit today. If you use inhalers (even only as needed), please bring them with you on the day of your procedure.  Your physician has requested that you go to the basement for lab work before leaving today.  We have sent the following medications to your pharmacy for you to pick up at your convenience: Prednisone 20 mg  Continue your Sulfasalazine.

## 2012-10-01 ENCOUNTER — Other Ambulatory Visit: Payer: BC Managed Care – PPO

## 2012-10-01 DIAGNOSIS — K625 Hemorrhage of anus and rectum: Secondary | ICD-10-CM

## 2012-10-01 DIAGNOSIS — R197 Diarrhea, unspecified: Secondary | ICD-10-CM

## 2012-10-01 DIAGNOSIS — R109 Unspecified abdominal pain: Secondary | ICD-10-CM

## 2012-10-03 ENCOUNTER — Other Ambulatory Visit: Payer: Self-pay | Admitting: Gastroenterology

## 2012-10-04 ENCOUNTER — Encounter: Payer: Self-pay | Admitting: Gastroenterology

## 2012-10-04 ENCOUNTER — Ambulatory Visit (AMBULATORY_SURGERY_CENTER): Payer: BC Managed Care – PPO | Admitting: Gastroenterology

## 2012-10-04 VITALS — BP 146/82 | HR 68 | Temp 97.7°F | Resp 19 | Ht 63.0 in | Wt 143.0 lb

## 2012-10-04 DIAGNOSIS — K625 Hemorrhage of anus and rectum: Secondary | ICD-10-CM

## 2012-10-04 DIAGNOSIS — K5010 Crohn's disease of large intestine without complications: Secondary | ICD-10-CM

## 2012-10-04 DIAGNOSIS — R197 Diarrhea, unspecified: Secondary | ICD-10-CM

## 2012-10-04 DIAGNOSIS — R109 Unspecified abdominal pain: Secondary | ICD-10-CM

## 2012-10-04 DIAGNOSIS — K5289 Other specified noninfective gastroenteritis and colitis: Secondary | ICD-10-CM

## 2012-10-04 HISTORY — PX: FLEXIBLE SIGMOIDOSCOPY: SHX1649

## 2012-10-04 MED ORDER — SODIUM CHLORIDE 0.9 % IV SOLN: 500.0000 mL | INTRAVENOUS | Status: DC

## 2012-10-04 MED ORDER — MESALAMINE 1000 MG RE SUPP: 1000.0000 mg | Freq: Every day | RECTAL | Status: DC

## 2012-10-04 NOTE — Progress Notes (Signed)
Patient did not experience any of the following events: a burn prior to discharge; a fall within the facility; wrong site/side/patient/procedure/implant event; or a hospital transfer or hospital admission upon discharge from the facility. (G8907) Patient did not have preoperative order for IV antibiotic SSI prophylaxis. (G8918)  

## 2012-10-04 NOTE — Op Note (Addendum)
[facility] [address] [place], [address]   FLEXIBLE SIGMOIDOSCOPY PROCEDURE REPORT  PATIENT: Judy Valdez, Judy Valdez  MR#: [id number] BIRTHDATE: 1950/10/08 , 62  yrs. old GENDER: Female ENDOSCOPIST: Mardella Layman, MD, [facility] REFERRED BY: PROCEDURE DATE:  10/04/2012 PROCEDURE: ASA CLASS:   2 INDICATIONS: IBD flare MEDICATIONS:   Propofol 250 mg IV  DESCRIPTION OF PROCEDURE:   the colonoscope was introduced into the right colon.  There is a very poor colon prep.  There were multiple pseudopolyps in the transverse and splenic flexure of the colon.  A left colon generally appeared normal several some mild proctitis that was biopsied.  There were no erosions, ulcerations, or bleeding noted.        COMPLICATIONS:  None  ENDOSCOPIC IMPRESSION: Crohn's colitis with inactivity and sent for very minimal rectal proctitis.  I will place her on Canasa 1 g suppositories at bedtime and stop her corticosteroid therapy. Stool exam for C. difficile is pending.  Patient's continue her aminosalicylate therapy as previously outlined.  RECOMMENDATIONS:   _______________________________ eSignedMardella Layman, MD, [facility] 10/04/2012 10:25 AM Revised: 10/04/2012 10:25 AM

## 2012-10-04 NOTE — Progress Notes (Signed)
Called to room to assist during endoscopic procedure.  Patient ID and intended procedure confirmed with present staff. Received instructions for my participation in the procedure from the performing physician.  

## 2012-10-04 NOTE — Patient Instructions (Addendum)
YOU HAD AN ENDOSCOPIC PROCEDURE TODAY AT THE [facility]: Refer to the procedure report that was given to you for any specific questions about what was found during the examination.  If the procedure report does not answer your questions, please call your gastroenterologist to clarify.  If you requested that your care partner not be given the details of your procedure findings, then the procedure report has been included in a sealed envelope for you to review at your convenience later.  YOU SHOULD EXPECT: Some feelings of bloating in the abdomen. Passage of more gas than usual.  Walking can help get rid of the air that was put into your GI tract during the procedure and reduce the bloating. If you had a lower endoscopy (such as a colonoscopy or flexible sigmoidoscopy) you may notice spotting of blood in your stool or on the toilet paper. If you underwent a bowel prep for your procedure, then you may not have a normal bowel movement for a few days.  DIET: Your first meal following the procedure should be a light meal and then it is ok to progress to your normal diet.  A half-sandwich or bowl of soup is an example of a good first meal.  Heavy or fried foods are harder to digest and may make you feel nauseous or bloated.  Likewise meals heavy in dairy and vegetables can cause extra gas to form and this can also increase the bloating.  Drink plenty of fluids but you should avoid alcoholic beverages for 24 hours.  ACTIVITY: Your care partner should take you home directly after the procedure.  You should plan to take it easy, moving slowly for the rest of the day.  You can resume normal activity the day after the procedure however you should NOT DRIVE or use heavy machinery for 24 hours (because of the sedation medicines used during the test).    SYMPTOMS TO REPORT IMMEDIATELY: A gastroenterologist can be reached at any hour.  During normal business hours, 8:30 AM to 5:00 PM Monday through Friday,  call [phone].  After hours and on weekends, please call the GI answering service at [phone] who will take a message and have the physician on call contact you.   Following lower endoscopy (colonoscopy or flexible sigmoidoscopy):  Excessive amounts of blood in the stool  Significant tenderness or worsening of abdominal pains  Swelling of the abdomen that is new, acute  Fever of 100F or higher    FOLLOW UP: If any biopsies were taken you will be contacted by phone or by letter within the next 1-3 weeks.  Call your gastroenterologist if you have not heard about the biopsies in 3 weeks.  Our staff will call the home number listed on your records the next business day following your procedure to check on you and address any questions or concerns that you may have at that time regarding the information given to you following your procedure. This is a courtesy call and so if there is no answer at the home number and we have not heard from you through the emergency physician on call, we will assume that you have returned to your regular daily activities without incident.  SIGNATURES/CONFIDENTIALITY: You and/or your care partner have signed paperwork which will be entered into your electronic medical record.  These signatures attest to the fact that that the information above on your After Visit Summary has been reviewed and is understood.  Full responsibility of the confidentiality   of this discharge information lies with you and/or your care-partner.   Office visit with Dr. Jarold Motto June 12,2014 @ 08:45 am

## 2012-10-05 ENCOUNTER — Telehealth: Payer: Self-pay | Admitting: *Deleted

## 2012-10-05 NOTE — Telephone Encounter (Signed)
No answer, message left for the patient. 

## 2012-10-07 ENCOUNTER — Encounter: Payer: Self-pay | Admitting: Gastroenterology

## 2012-10-12 ENCOUNTER — Other Ambulatory Visit: Payer: Self-pay | Admitting: Gastroenterology

## 2012-11-04 ENCOUNTER — Ambulatory Visit: Payer: BC Managed Care – PPO | Admitting: Gastroenterology

## 2012-11-12 ENCOUNTER — Other Ambulatory Visit: Payer: Self-pay | Admitting: Gastroenterology

## 2012-11-16 ENCOUNTER — Ambulatory Visit (INDEPENDENT_AMBULATORY_CARE_PROVIDER_SITE_OTHER): Payer: BC Managed Care – PPO | Admitting: Gastroenterology

## 2012-11-16 ENCOUNTER — Encounter: Payer: Self-pay | Admitting: Gastroenterology

## 2012-11-16 VITALS — BP 118/78 | HR 87 | Ht 64.0 in | Wt 143.0 lb

## 2012-11-16 DIAGNOSIS — K5010 Crohn's disease of large intestine without complications: Secondary | ICD-10-CM

## 2012-11-16 NOTE — Patient Instructions (Signed)
Please follow in one year with Dr Jarold Motto

## 2012-11-16 NOTE — Progress Notes (Signed)
History of Present Illness: This is a 62 year old Caucasian female with Crohn's colitis who had recent sigmoidoscopy which showed only minimal proctitis some pseudopolyps.  Biopsy showed no evidence of dysplasia.  She is prescribed Canasa suppositories which he has not used.  She currently is in remission on Azulfidine 1 g twice a day denies any GI complaints such as rectal bleeding, diarrhea or abdominal pain.  Her medical history otherwise is doing well, and her multiple medications listed and reviewed.    Current Medications, Allergies, Past Medical History, Past Surgical History, Family History and Social History were reviewed in [employer] record.  ROS: All systems were reviewed and are negative unless otherwise stated in the HPI.         Assessment and plan: Crohn's colitis in remission on standard doses of amino salicylates.  We will see her on a when necessary basis or every year as needed.  She's continue medications as listed as reviewed with when necessary followup visits.  No diagnosis found.

## 2012-11-30 ENCOUNTER — Other Ambulatory Visit: Payer: Self-pay | Admitting: Gastroenterology

## 2012-12-14 ENCOUNTER — Other Ambulatory Visit: Payer: Self-pay | Admitting: Gastroenterology

## 2012-12-18 ENCOUNTER — Other Ambulatory Visit: Payer: Self-pay | Admitting: Gastroenterology

## 2012-12-19 ENCOUNTER — Other Ambulatory Visit: Payer: Self-pay | Admitting: Gastroenterology

## 2013-01-24 ENCOUNTER — Other Ambulatory Visit: Payer: Self-pay | Admitting: Gastroenterology

## 2013-01-29 ENCOUNTER — Other Ambulatory Visit: Payer: Self-pay | Admitting: Gastroenterology

## 2013-02-02 ENCOUNTER — Other Ambulatory Visit: Payer: Self-pay | Admitting: Gastroenterology

## 2013-02-18 ENCOUNTER — Other Ambulatory Visit: Payer: Self-pay | Admitting: Gastroenterology

## 2013-02-26 ENCOUNTER — Other Ambulatory Visit: Payer: Self-pay | Admitting: Gastroenterology

## 2013-03-19 ENCOUNTER — Other Ambulatory Visit: Payer: Self-pay | Admitting: Gastroenterology

## 2013-03-30 ENCOUNTER — Other Ambulatory Visit: Payer: Self-pay | Admitting: Gastroenterology

## 2013-05-02 ENCOUNTER — Other Ambulatory Visit: Payer: Self-pay | Admitting: Gastroenterology

## 2013-05-30 ENCOUNTER — Other Ambulatory Visit: Payer: Self-pay | Admitting: Gastroenterology

## 2013-05-30 NOTE — Telephone Encounter (Signed)
Patient requesting refill on Wellbutrin, do you want to refill?

## 2013-06-12 ENCOUNTER — Other Ambulatory Visit: Payer: Self-pay | Admitting: Gastroenterology

## 2013-06-28 ENCOUNTER — Other Ambulatory Visit: Payer: Self-pay | Admitting: Gastroenterology

## 2013-08-06 ENCOUNTER — Other Ambulatory Visit: Payer: Self-pay | Admitting: Gastroenterology

## 2013-08-08 NOTE — Telephone Encounter (Signed)
Yes - ok to refill 1 month beyond appointment

## 2013-08-08 NOTE — Telephone Encounter (Signed)
LMOM for patient to call back.

## 2013-08-08 NOTE — Telephone Encounter (Signed)
LMOM for patient to call office back. 

## 2013-08-08 NOTE — Telephone Encounter (Signed)
Patient made a follow up visit with you for Oct 12, 2013. Patient is requesting a refill on her Nascobal. Is it ok to refill until appointment?

## 2013-08-10 ENCOUNTER — Telehealth: Payer: Self-pay | Admitting: Internal Medicine

## 2013-08-10 NOTE — Telephone Encounter (Signed)
Sample of Nascobal put up front for pick up, back ordered at her pharmacy.  She wants to discuss the B 12 injections at her May visit with Dr.Gessner.

## 2013-08-16 ENCOUNTER — Other Ambulatory Visit: Payer: Self-pay | Admitting: Gastroenterology

## 2013-08-16 NOTE — Telephone Encounter (Signed)
OK 

## 2013-08-16 NOTE — Telephone Encounter (Signed)
Patient is requesting refill on Sulfasalazine Patient has follow up visit with you in May Is it ok to refill?

## 2013-09-03 ENCOUNTER — Other Ambulatory Visit: Payer: Self-pay | Admitting: Gastroenterology

## 2013-09-05 NOTE — Telephone Encounter (Signed)
OK to refill x 2

## 2013-09-05 NOTE — Telephone Encounter (Signed)
Patient has a follow up visit for 10-12-2013. Patient is requesting refill of Wellbutrin. Is it ok to refill?

## 2013-09-20 ENCOUNTER — Telehealth: Payer: Self-pay | Admitting: Internal Medicine

## 2013-09-20 NOTE — Telephone Encounter (Signed)
Patient wants to know if you would advise her to get a Vitamin B12 injection via PCP since she's unable to get Nascobal.  Per [facility] it's on back order thru the end of May.  She's due on Friday for a dose.  Please advise Sir.  She's a Sharlett Iles patient and has upcoming appointment to see you Lessie Dings, May 20th.  Thank you.

## 2013-09-20 NOTE — Telephone Encounter (Signed)
Patient informed and will contact her PCP office tomorrow.  She was given our # in case they have any questions.

## 2013-09-20 NOTE — Telephone Encounter (Signed)
Monthly injection B12 1000 ug is reasonable if she can get that

## 2013-09-30 ENCOUNTER — Encounter: Payer: Self-pay | Admitting: Gastroenterology

## 2013-10-03 ENCOUNTER — Other Ambulatory Visit: Payer: Self-pay | Admitting: Gastroenterology

## 2013-10-05 ENCOUNTER — Other Ambulatory Visit: Payer: Self-pay | Admitting: Internal Medicine

## 2013-10-05 NOTE — Telephone Encounter (Signed)
Ok to refill Sir, has appointment with you 10/12/13.  You last refilled it 09/05/13.  This is a [facility] patient.  Thanks for your time.

## 2013-10-06 NOTE — Telephone Encounter (Signed)
Refill x 2 please 

## 2013-10-12 ENCOUNTER — Ambulatory Visit (INDEPENDENT_AMBULATORY_CARE_PROVIDER_SITE_OTHER): Payer: BC Managed Care – PPO | Admitting: Internal Medicine

## 2013-10-12 ENCOUNTER — Encounter: Payer: Self-pay | Admitting: Internal Medicine

## 2013-10-12 ENCOUNTER — Other Ambulatory Visit (INDEPENDENT_AMBULATORY_CARE_PROVIDER_SITE_OTHER): Payer: BC Managed Care – PPO

## 2013-10-12 VITALS — BP 104/70 | HR 84 | Ht 64.0 in | Wt 149.0 lb

## 2013-10-12 DIAGNOSIS — E538 Deficiency of other specified B group vitamins: Secondary | ICD-10-CM

## 2013-10-12 DIAGNOSIS — K5010 Crohn's disease of large intestine without complications: Secondary | ICD-10-CM

## 2013-10-12 LAB — CBC WITH DIFFERENTIAL/PLATELET
BASOS PCT: 0.5 % (ref 0.0–3.0)
Basophils Absolute: 0 10*3/uL (ref 0.0–0.1)
EOS ABS: 0.2 10*3/uL (ref 0.0–0.7)
EOS PCT: 4.1 % (ref 0.0–5.0)
HEMATOCRIT: 40.7 % (ref 36.0–46.0)
Hemoglobin: 13.8 g/dL (ref 12.0–15.0)
LYMPHS ABS: 1.3 10*3/uL (ref 0.7–4.0)
Lymphocytes Relative: 28.4 % (ref 12.0–46.0)
MCHC: 33.8 g/dL (ref 30.0–36.0)
MCV: 96.3 fl (ref 78.0–100.0)
MONO ABS: 0.5 10*3/uL (ref 0.1–1.0)
Monocytes Relative: 10.9 % (ref 3.0–12.0)
NEUTROS ABS: 2.6 10*3/uL (ref 1.4–7.7)
Neutrophils Relative %: 56.1 % (ref 43.0–77.0)
Platelets: 280 10*3/uL (ref 150.0–400.0)
RBC: 4.23 Mil/uL (ref 3.87–5.11)
RDW: 14.3 % (ref 11.5–15.5)
WBC: 4.6 10*3/uL (ref 4.0–10.5)

## 2013-10-12 LAB — COMPREHENSIVE METABOLIC PANEL
ALK PHOS: 61 U/L (ref 39–117)
ALT: 23 U/L (ref 0–35)
AST: 20 U/L (ref 0–37)
Albumin: 4 g/dL (ref 3.5–5.2)
BUN: 23 mg/dL (ref 6–23)
CO2: 25 mEq/L (ref 19–32)
CREATININE: 1.1 mg/dL (ref 0.4–1.2)
Calcium: 9.6 mg/dL (ref 8.4–10.5)
Chloride: 106 mEq/L (ref 96–112)
GFR: 53.48 mL/min — ABNORMAL LOW (ref >60.00)
GLUCOSE: 91 mg/dL (ref 70–99)
Potassium: 4.7 mEq/L (ref 3.5–5.1)
Sodium: 138 mEq/L (ref 135–145)
Total Bilirubin: 0.5 mg/dL (ref 0.2–1.2)
Total Protein: 7.3 g/dL (ref 6.0–8.3)

## 2013-10-12 MED ORDER — SULFASALAZINE 500 MG PO TABS: 1000.0000 mg | ORAL_TABLET | Freq: Two times a day (BID) | ORAL | Status: DC

## 2013-10-12 NOTE — Assessment & Plan Note (Addendum)
Dx PA 15 years ago - Tx steroids initially Has done well on Azulfidine 2 g/day x years Will continue RTC 1 year I think colonoscopy q 3 yr makes sense - will recall later this year as husband getting Tx intensively now CBC, CMET, vit D today

## 2013-10-12 NOTE — Progress Notes (Signed)
Subjective:    Patient ID: Judy Valdez, female    DOB: 1950/12/31, 63 y.o.   MRN: [id number]  HPI This is a very nice lady with a long hx Crohn's colitis controlled by sulfasalazine. She is well - no sxs. Had a flex sig for some rectal bleeding 2014 and colonoscopy 2011. She is under impression Dr. Sharlett Iles said repeat colonoscopy in 2016.  Husband to start hyperbaric Tx for XRT burns related to metastatic prostate cancer Tx [facility]).   She has quit smoking again.  Allergies  Allergen Reactions  . Erythromycin Other (See Comments)    Abdominal pain   Outpatient Prescriptions Prior to Visit  Medication Sig Dispense Refill  . buPROPion (WELLBUTRIN XL) 150 MG 24 hr tablet TAKE 1 TABLET BY MOUTH EVERY DAY  30 tablet  1  . butalbital-acetaminophen-caffeine (FIORICET, ESGIC) 50-325-40 MG per tablet       . Calcium Carbonate-Vitamin D 600-400 MG-UNIT per tablet Take 1 tablet by mouth daily.        . Famotidine (PEPCID PO) Take 20 mg by mouth daily.       . fexofenadine (ALLEGRA) 60 MG tablet Take 60 mg by mouth daily.        Marland Kitchen FLUoxetine (PROZAC) 20 MG capsule Take 20 mg by mouth daily.        . folic acid (FOLVITE) 1 MG tablet TAKE 1 TABLET BY MOUTH EVERY DAY  90 tablet  0  . ibuprofen (ADVIL,MOTRIN) 200 MG tablet Take 200 mg by mouth every 6 (six) hours as needed. For headache       . Multiple Vitamins-Minerals (CENTRUM SILVER) tablet Take 1 tablet by mouth daily.       Marland Kitchen topiramate (TOPAMAX) 25 MG tablet Take 75 mg by mouth at bedtime.       . sulfaSALAzine (AZULFIDINE) 500 MG tablet TAKE 2 TABLETS BY MOUTH TWICE DAILY  120 tablet  0  . baclofen (LIORESAL) 10 MG tablet       . fluticasone (FLONASE) 50 MCG/ACT nasal spray       . guaiFENesin (MUCINEX) 600 MG 12 hr tablet Take 600 mg by mouth daily.        Marland Kitchen LUMIGAN 0.01 % SOLN Place 1 drop into both eyes at bedtime.       Marland Kitchen NASCOBAL 500 MCG/0.1ML SOLN INSTILL 1 SPRAY IN EACH NOSTRIL EVERY WEEK  1.3 mL  2   No  facility-administered medications prior to visit.   Past Medical History  Diagnosis Date  . Crohn disease   . Depressive disorder, not elsewhere classified   . Personal history of peptic ulcer disease   . Vitamin B12 deficiency   . Colitis    Past Surgical History  Procedure Laterality Date  . Myomectomy    . Cesarean section      x2  . Abdominal hysterectomy    . Retinal detachment surgery      left  . Colonoscopy  09/11/08, 10/02/10  . Flexible sigmoidoscopy  10/04/12   History   Social History  . Marital Status: Married   Occupational History  . retired    Social History Main Topics  . Smoking status: Former Smoker -- 0.50 packs/day    Types: Cigarettes    Quit date: 09/24/2011  . Smokeless tobacco: Never Used  . Alcohol Use: 12.6 oz/week    21 Glasses of wine per week     Comment: 2- 6 ounce glasses wine daily  .  Drug Use: No   Family History  Problem Relation Age of Onset  . Heart disease Father   . Colon cancer Neg Hx         Review of Systems     Objective:   Physical Exam General:  NAD Eyes:   anicteric Lungs:  clear Heart:  S1S2 no rubs, murmurs or gallops Abdomen:  soft and nontender, BS+ Ext:   no edema  Reviewed prior GI notes and colonoscopy/sigmoidoscopy/pathology         Assessment & Plan:

## 2013-10-12 NOTE — Patient Instructions (Addendum)
We have sent the following medications to your pharmacy for you to pick up at your convenience: Sulfasalazine  Your physician has requested that you go to the basement for the following lab work before leaving today: CBC, Vitamin D, CMET  We will contact you when your due for your colonoscopy for November 2015.   I appreciate the opportunity to care for you.

## 2013-10-12 NOTE — Assessment & Plan Note (Signed)
Nascobal unavailable Is getting injections at PCP I mentioned she could do at home if desired

## 2013-10-13 LAB — VITAMIN D 25 HYDROXY (VIT D DEFICIENCY, FRACTURES): Vit D, 25-Hydroxy: 29 ng/mL — ABNORMAL LOW (ref 30–89)

## 2013-10-14 NOTE — Progress Notes (Signed)
Quick Note:  Labs look fine - vit D is borderline  1000 mg/day Vit D3 and some sun ______

## 2013-11-11 ENCOUNTER — Encounter: Payer: Self-pay | Admitting: Gastroenterology

## 2013-12-07 ENCOUNTER — Other Ambulatory Visit: Payer: Self-pay | Admitting: Internal Medicine

## 2013-12-07 NOTE — Telephone Encounter (Signed)
Please advise if ok to refill , thank you.

## 2013-12-31 ENCOUNTER — Other Ambulatory Visit: Payer: Self-pay | Admitting: Internal Medicine

## 2014-01-27 ENCOUNTER — Other Ambulatory Visit: Payer: Self-pay | Admitting: Internal Medicine

## 2014-01-27 NOTE — Telephone Encounter (Signed)
Ok to refill x 6  

## 2014-01-27 NOTE — Telephone Encounter (Signed)
Please advise Sir, thank you. 

## 2014-03-24 ENCOUNTER — Other Ambulatory Visit: Payer: Self-pay | Admitting: Internal Medicine

## 2014-04-24 ENCOUNTER — Other Ambulatory Visit: Payer: Self-pay | Admitting: Internal Medicine

## 2014-05-09 ENCOUNTER — Encounter: Payer: Self-pay | Admitting: Internal Medicine

## 2014-05-27 ENCOUNTER — Other Ambulatory Visit: Payer: Self-pay | Admitting: Internal Medicine

## 2014-05-28 NOTE — Telephone Encounter (Signed)
Please advise Sir, thank you. 

## 2014-05-29 NOTE — Telephone Encounter (Signed)
Refill x 1 year 

## 2014-06-21 ENCOUNTER — Other Ambulatory Visit: Payer: Self-pay | Admitting: Internal Medicine

## 2014-08-28 ENCOUNTER — Encounter: Payer: Self-pay | Admitting: Internal Medicine

## 2014-08-31 ENCOUNTER — Other Ambulatory Visit: Payer: Self-pay | Admitting: Internal Medicine

## 2014-08-31 NOTE — Telephone Encounter (Signed)
Ok to refill Sir? 

## 2014-08-31 NOTE — Telephone Encounter (Signed)
Ok to refill x 1 year 

## 2014-09-05 ENCOUNTER — Other Ambulatory Visit: Payer: Self-pay | Admitting: Internal Medicine

## 2014-09-05 NOTE — Telephone Encounter (Signed)
Ok x 12 months 

## 2014-09-05 NOTE — Telephone Encounter (Signed)
Ok to refill Sir? 

## 2014-10-15 ENCOUNTER — Other Ambulatory Visit: Payer: Self-pay | Admitting: Internal Medicine

## 2014-10-24 ENCOUNTER — Other Ambulatory Visit: Payer: Self-pay | Admitting: Internal Medicine

## 2015-01-31 ENCOUNTER — Other Ambulatory Visit: Payer: Self-pay | Admitting: Internal Medicine

## 2015-03-12 ENCOUNTER — Other Ambulatory Visit: Payer: Self-pay | Admitting: Internal Medicine

## 2015-04-08 ENCOUNTER — Other Ambulatory Visit: Payer: Self-pay | Admitting: Internal Medicine

## 2015-04-10 ENCOUNTER — Other Ambulatory Visit: Payer: Self-pay | Admitting: Internal Medicine

## 2015-05-26 ENCOUNTER — Other Ambulatory Visit: Payer: Self-pay | Admitting: Internal Medicine

## 2015-06-06 ENCOUNTER — Other Ambulatory Visit: Payer: Self-pay | Admitting: Internal Medicine

## 2015-06-19 ENCOUNTER — Other Ambulatory Visit: Payer: Self-pay | Admitting: Internal Medicine

## 2015-06-19 NOTE — Telephone Encounter (Signed)
Please advise Sir, thanks. 

## 2015-06-19 NOTE — Telephone Encounter (Signed)
Refill x 1 year Ask her to schedule a f/u this year please

## 2015-07-02 ENCOUNTER — Other Ambulatory Visit: Payer: Self-pay | Admitting: Internal Medicine

## 2015-07-04 ENCOUNTER — Other Ambulatory Visit: Payer: Self-pay | Admitting: Internal Medicine

## 2015-07-04 NOTE — Telephone Encounter (Signed)
Please advise Sir, thank you. 

## 2015-07-04 NOTE — Telephone Encounter (Signed)
Ok to refill x 1 year 

## 2015-07-24 ENCOUNTER — Other Ambulatory Visit: Payer: Self-pay | Admitting: Internal Medicine

## 2015-07-25 ENCOUNTER — Encounter: Payer: Self-pay | Admitting: Internal Medicine

## 2015-07-25 ENCOUNTER — Other Ambulatory Visit (INDEPENDENT_AMBULATORY_CARE_PROVIDER_SITE_OTHER): Payer: BLUE CROSS/BLUE SHIELD

## 2015-07-25 ENCOUNTER — Ambulatory Visit (INDEPENDENT_AMBULATORY_CARE_PROVIDER_SITE_OTHER): Payer: BLUE CROSS/BLUE SHIELD | Admitting: Internal Medicine

## 2015-07-25 VITALS — BP 120/80 | HR 84 | Ht 64.0 in | Wt 145.0 lb

## 2015-07-25 DIAGNOSIS — R1115 Cyclical vomiting syndrome unrelated to migraine: Secondary | ICD-10-CM | POA: Insufficient documentation

## 2015-07-25 DIAGNOSIS — R1110 Vomiting, unspecified: Secondary | ICD-10-CM

## 2015-07-25 DIAGNOSIS — K5010 Crohn's disease of large intestine without complications: Secondary | ICD-10-CM | POA: Diagnosis not present

## 2015-07-25 LAB — VITAMIN D 25 HYDROXY (VIT D DEFICIENCY, FRACTURES): VITD: 43.48 ng/mL (ref 30.00–100.00)

## 2015-07-25 NOTE — Patient Instructions (Signed)
   We have sent the following medications to your pharmacy for you to pick up at your convenience: Sulfasalazine  Continue folic acid per Dr Carlean Purl.   Your physician has requested that you go to the basement for the following lab work before leaving today: Vitamin D level   It has been recommended to you by your physician that you have a(n) EGD/colonoscopy completed. Per your request, we did not schedule the procedure(s) today. Please contact our office at [phone]  to have the procedure completed.  I appreciate the opportunity to care for you. Silvano Rusk, MD, [facility]

## 2015-07-25 NOTE — Progress Notes (Signed)
   Subjective:    Patient ID: Judy Valdez, female    DOB: Jul 08, 1950, 65 y.o.   MRN: [id number] Chief complaint: Follow-up of Crohn's colitis HPI The patient returns, she was last seen in 2015. Unfortunately her husband passed away from metastatic prostate cancer in the interim. Currently she is doing very well with respect to her colitis having regular defecation no bleeding or diarrhea. However about 3 or 4 days of week she will have some borborygmi and probably in her upper abdomen and she regurgitates or vomits. There is no weight loss. This is a new problem over the past several months. She has used some famotidine to try to help. She is well in between these acute self-limited episodes.  Medications, allergies, past medical history, past surgical history, family history and social history are reviewed and updated in the EMR.  Review of Systems As above    Objective:   Physical Exam @BP  120/80 mmHg  Pulse 84  Ht 5\' 4"  (1.626 m)  Wt 145 lb (65.772 kg)  BMI 24.88 kg/m2@  General:  NAD Eyes:   anicteric Lungs:  clear Heart:: S1S2 no rubs, murmurs or gallops Abdomen:  soft and nontender, BS+ Ext:   no edema, cyanosis or clubbing    Data Reviewed:          Assessment & Plan:   1. Crohn's colitis, without complications ([place])   2. Persistent vomiting    We'll schedule her for an EGD and a colonoscopy to follow-up on her Crohn's colitis and evaluate the symptoms. Vitamin D level was rechecked, needed to get a CBC as well and I did not communicate that when she was in the office but we will call her about that. Her vitamin D level is returned in the 40s so it's up.  When she was in the office a did not think that I think taking a PPI on a daily basis is a reasonable thing to do to see if that relieves the symptoms. She will scheduling her procedures for May.The risks and benefits as well as alternatives of endoscopic procedure(s) have been discussed and reviewed. All questions  answered. The patient agrees to proceed.

## 2015-07-25 NOTE — Assessment & Plan Note (Signed)
OK Refill meds Labs colonoscopy

## 2015-07-26 MED ORDER — PANTOPRAZOLE SODIUM 40 MG PO TBEC: 40.0000 mg | DELAYED_RELEASE_TABLET | Freq: Every day | ORAL | Status: DC

## 2015-07-26 NOTE — Progress Notes (Signed)
Quick Note:  Vit D looks great  She was supposed to have a CBC - please apologize for me that we missed that - can she do that at her convenience please? ______

## 2015-07-27 ENCOUNTER — Telehealth: Payer: Self-pay

## 2015-07-27 NOTE — Telephone Encounter (Signed)
Left message that generic Protonix sent in for her vomiting.

## 2015-07-27 NOTE — Telephone Encounter (Signed)
-----   Message from Gatha Mayer, MD sent at 07/26/2015  6:42 PM EST ----- Regarding: New medication Please tell her I decided to put her on generic Protonix to see if that helps her intermittent vomiting symptoms. I sent the prescription so she can just pick it up at her pharmacy

## 2015-08-31 ENCOUNTER — Other Ambulatory Visit: Payer: Self-pay | Admitting: Internal Medicine

## 2015-08-31 NOTE — Telephone Encounter (Signed)
Refill x 1 year ok 

## 2015-08-31 NOTE — Telephone Encounter (Signed)
May I refill Sir, thank you. 

## 2015-09-22 ENCOUNTER — Other Ambulatory Visit: Payer: Self-pay | Admitting: Internal Medicine

## 2015-10-12 ENCOUNTER — Other Ambulatory Visit: Payer: Self-pay | Admitting: Internal Medicine

## 2015-10-24 ENCOUNTER — Encounter: Payer: Self-pay | Admitting: Gastroenterology

## 2015-12-18 ENCOUNTER — Other Ambulatory Visit: Payer: Self-pay | Admitting: Internal Medicine

## 2016-01-08 ENCOUNTER — Other Ambulatory Visit: Payer: Self-pay | Admitting: Internal Medicine

## 2016-01-09 NOTE — Telephone Encounter (Signed)
Ok to refill Sir? 

## 2016-01-10 NOTE — Telephone Encounter (Signed)
Refill sent in for 1 year.

## 2016-01-10 NOTE — Telephone Encounter (Signed)
Refill x 1 year 

## 2016-01-23 ENCOUNTER — Other Ambulatory Visit: Payer: Self-pay | Admitting: Internal Medicine

## 2016-03-14 ENCOUNTER — Other Ambulatory Visit: Payer: Self-pay | Admitting: Internal Medicine

## 2016-03-14 NOTE — Telephone Encounter (Signed)
Refill x 12 

## 2016-03-14 NOTE — Telephone Encounter (Signed)
Refill Sir? 

## 2016-04-15 ENCOUNTER — Other Ambulatory Visit: Payer: Self-pay | Admitting: Internal Medicine

## 2016-07-06 ENCOUNTER — Other Ambulatory Visit: Payer: Self-pay | Admitting: Internal Medicine

## 2016-07-14 ENCOUNTER — Other Ambulatory Visit: Payer: Self-pay | Admitting: Internal Medicine

## 2016-07-14 NOTE — Telephone Encounter (Signed)
Refill x 1 year 

## 2016-07-14 NOTE — Telephone Encounter (Signed)
Please advise Sir? 

## 2016-07-18 ENCOUNTER — Other Ambulatory Visit: Payer: Self-pay

## 2016-07-18 MED ORDER — CYANOCOBALAMIN 500 MCG/0.1ML NA SOLN: NASAL | 3 refills | Status: DC

## 2016-07-18 NOTE — Telephone Encounter (Signed)
90 day supply sent in for her Nascobal nasal spray as requested to [facility].

## 2016-07-21 ENCOUNTER — Other Ambulatory Visit: Payer: Self-pay | Admitting: Internal Medicine

## 2016-07-21 NOTE — Telephone Encounter (Signed)
  Patient is going to meet with the Medicare rep tomorrow via the phone , she goes on this in May.  For now her insurance is making her get her [facility]ascobal [facility] instead of her local pharmacy.  She is in [place] for the winter and has a [place] due 07/25/16 in [place].  She said to tell Dr. Carlean Purl that she is feeling good.

## 2016-07-21 NOTE — Telephone Encounter (Signed)
Faxed the refill request back to [facility] for her Nascobal nasal spray with new sig:  One spray in nostril  every week .  I called and told [name] of the change in directions.

## 2016-08-21 ENCOUNTER — Other Ambulatory Visit: Payer: Self-pay | Admitting: Internal Medicine

## 2016-08-21 NOTE — Telephone Encounter (Signed)
OK to refill x 2 mos max Needs REV

## 2016-08-21 NOTE — Telephone Encounter (Signed)
How many refills Sir, thank yo.

## 2016-09-24 ENCOUNTER — Other Ambulatory Visit: Payer: Self-pay | Admitting: Internal Medicine

## 2016-09-24 NOTE — Telephone Encounter (Signed)
OK to refill x 3 mos When last seen I was expecting her to schedule an EGD and colonoscopy If not still vomiting does not need EGD but does need to set up colonoscopy dx Crohn's colitis If won't do that she needs an OV

## 2016-09-24 NOTE — Telephone Encounter (Signed)
May I refill Sir? 

## 2016-09-25 NOTE — Telephone Encounter (Signed)
Left [place] detailed message to call be back.

## 2016-09-30 NOTE — Telephone Encounter (Signed)
Left another message for her to call me.

## 2016-10-01 ENCOUNTER — Telehealth: Payer: Self-pay | Admitting: Internal Medicine

## 2016-10-01 NOTE — Telephone Encounter (Signed)
Message is under 09/24/16 refill request.

## 2016-10-01 NOTE — Telephone Encounter (Signed)
Judy Valdez has had phone issues and was unable to call me back sooner.  Today she called in and we were able to speak. I explained what Dr Carlean Purl advised and she wants to come in and talk about procedures and meds now that she is medicare. I set her up an appointment for 10/09/16 at 11:30AM.

## 2016-10-07 ENCOUNTER — Other Ambulatory Visit: Payer: Self-pay | Admitting: Internal Medicine

## 2016-10-09 ENCOUNTER — Other Ambulatory Visit (INDEPENDENT_AMBULATORY_CARE_PROVIDER_SITE_OTHER): Payer: Medicare Other

## 2016-10-09 ENCOUNTER — Encounter: Payer: Self-pay | Admitting: Internal Medicine

## 2016-10-09 ENCOUNTER — Ambulatory Visit (INDEPENDENT_AMBULATORY_CARE_PROVIDER_SITE_OTHER): Payer: Medicare Other | Admitting: Internal Medicine

## 2016-10-09 VITALS — BP 120/80 | HR 96 | Ht 63.0 in | Wt 143.5 lb

## 2016-10-09 DIAGNOSIS — E538 Deficiency of other specified B group vitamins: Secondary | ICD-10-CM

## 2016-10-09 DIAGNOSIS — R1110 Vomiting, unspecified: Secondary | ICD-10-CM

## 2016-10-09 DIAGNOSIS — K5010 Crohn's disease of large intestine without complications: Secondary | ICD-10-CM

## 2016-10-09 DIAGNOSIS — R1115 Cyclical vomiting syndrome unrelated to migraine: Secondary | ICD-10-CM

## 2016-10-09 LAB — CBC WITH DIFFERENTIAL/PLATELET
BASOS ABS: 0 10*3/uL (ref 0.0–0.1)
BASOS PCT: 0.7 % (ref 0.0–3.0)
Eosinophils Absolute: 0.2 10*3/uL (ref 0.0–0.7)
Eosinophils Relative: 3.6 % (ref 0.0–5.0)
HEMATOCRIT: 43.1 % (ref 36.0–46.0)
HEMOGLOBIN: 14.4 g/dL (ref 12.0–15.0)
LYMPHS PCT: 26.8 % (ref 12.0–46.0)
Lymphs Abs: 1.2 10*3/uL (ref 0.7–4.0)
MCHC: 33.4 g/dL (ref 30.0–36.0)
MCV: 95.3 fl (ref 78.0–100.0)
MONO ABS: 0.5 10*3/uL (ref 0.1–1.0)
Monocytes Relative: 10.3 % (ref 3.0–12.0)
Neutro Abs: 2.6 10*3/uL (ref 1.4–7.7)
Neutrophils Relative %: 58.6 % (ref 43.0–77.0)
Platelets: 251 10*3/uL (ref 150.0–400.0)
RBC: 4.52 Mil/uL (ref 3.87–5.11)
RDW: 14.2 % (ref 11.5–15.5)
WBC: 4.4 10*3/uL (ref 4.0–10.5)

## 2016-10-09 LAB — COMPREHENSIVE METABOLIC PANEL
ALBUMIN: 4.6 g/dL (ref 3.5–5.2)
ALT: 14 U/L (ref 0–35)
AST: 14 U/L (ref 0–37)
Alkaline Phosphatase: 80 U/L (ref 39–117)
BILIRUBIN TOTAL: 0.5 mg/dL (ref 0.2–1.2)
BUN: 25 mg/dL — AB (ref 6–23)
CALCIUM: 9.5 mg/dL (ref 8.4–10.5)
CHLORIDE: 105 meq/L (ref 96–112)
CO2: 26 mEq/L (ref 19–32)
CREATININE: 0.94 mg/dL (ref 0.40–1.20)
GFR: 63.51 mL/min (ref >60.00)
Glucose, Bld: 87 mg/dL (ref 70–99)
Potassium: 4.1 mEq/L (ref 3.5–5.1)
SODIUM: 138 meq/L (ref 135–145)
TOTAL PROTEIN: 7.7 g/dL (ref 6.0–8.3)

## 2016-10-09 LAB — TSH: TSH: 2.65 u[IU]/mL (ref 0.35–4.50)

## 2016-10-09 MED ORDER — CYANOCOBALAMIN 1000 MCG/ML IJ SOLN: 1000.0000 ug | INTRAMUSCULAR | 1 refills | Status: AC

## 2016-10-09 NOTE — Patient Instructions (Addendum)
  It has been recommended to you by your physician that you have a(n) colonoscopy completed. Per your request, we did not schedule the procedure(s) today. Please contact our office at [phone] should you decide to have the procedure completed.   We are giving you a printed rx for the B12 to take to the pharmacy when you need it.  Your physician has requested that you go to the basement for the following lab work before leaving today: CBC, CMET, TSH  Follow up with your PCP.   I appreciate the opportunity to care for you. Silvano Rusk, MD, [facility]

## 2016-10-09 NOTE — Progress Notes (Signed)
Judy Valdez 66 y.o. 07/26/1950 [id number]  Assessment & Plan:   Encounter Diagnoses  Name Primary?  . Crohn's disease of colon without complication ([place]) Yes  . B12 deficiency   . Persistent vomiting      Labs today CBC CMET, TSH. Will forward these to her primary care provider as well. Technically she should have a CBC every [place] on sulfasalazine though she's never had any hematologic abnormalities with this.  Surveillance colonoscopy will be scheduled for August. We will need to call her and remind her and set up a previous visit for that.  Vomiting is much better she responded to PPI there might of been a stress component. I had originally thought about doing an EGD last year when I saw her. She's had no significant weight loss etc. so she wants to hold off on that unless things worsen.  Switch to injectable B12 with thousand micrograms monthly.   Return in about 1 year sooner if needed  I appreciate the opportunity to care for this patient. CC: Bartholome Bill, MD  Subjective:   Chief Complaint: Follow-up of Crohn's disease and B12 deficiency  HPI Judy Valdez is here, feeling well without problems. She would rather not repeat a colonoscopy due to the prep causing problems for her and not feeling well for a week or 2 afterwards but is willing to do so this year. She would like to do it in August. She is not having any lower GI symptoms at this time. Vomiting she was having last year is nearly gone only rare occasions. It might be more regurgitation. Pantoprazole did help that a lot.  Allergies  Allergen Reactions  . Erythromycin Other (See Comments)    Abdominal pain    Current Outpatient Prescriptions:  .  topiramate (TOPAMAX) 25 MG tablet, Take 25 mg by mouth 2 (two) times daily., Disp: , Rfl:  .  AZOPT 1 % ophthalmic suspension, SHAKE LQ AND INT 1 GTT IN OU TID, Disp: , Rfl: 4 .  buPROPion (WELLBUTRIN XL) 150 MG 24 hr tablet, TAKE 1 TABLET BY MOUTH EVERY  DAY, Disp: 30 tablet, Rfl: 2 .  butalbital-acetaminophen-caffeine (FIORICET, ESGIC) 50-325-40 MG per tablet, , Disp: , Rfl:  .  Calcium Carbonate-Vitamin D 600-400 MG-UNIT per tablet, Take 1 tablet by mouth daily.  , Disp: , Rfl:  .  cyanocobalamin (,VITAMIN B-12,) 1000 MCG/ML injection, Inject 1 mL (1,000 mcg total) into the muscle every 30 (thirty) days. Please also supply administration syringes, Disp: 10 mL, Rfl: 1 .  DENTA 5000 PLUS 1.1 % CREA dental cream, BRUSH FOR A FULL 2 MINUTES AT BEDTIME. SPIT OUT BUT DO NOT RINSE FOR 30 MINUTES, Disp: , Rfl: 1 .  Famotidine (PEPCID PO), Take 20 mg by mouth daily. , Disp: , Rfl:  .  fexofenadine (ALLEGRA) 60 MG tablet, Take 60 mg by mouth daily.  , Disp: , Rfl:  .  FLUoxetine (PROZAC) 20 MG capsule, Take 20 mg by mouth daily.  , Disp: , Rfl:  .  folic acid (FOLVITE) 1 MG tablet, TAKE 1 TABLET BY MOUTH EVERY DAY, Disp: 90 tablet, Rfl: 3 .  ketoconazole (NIZORAL) 2 % shampoo, U UTD ONCE OR TWICE WEEKLY, Disp: , Rfl: 2 .  LUMIGAN 0.01 % SOLN, , Disp: , Rfl: 12 .  Multiple Vitamins-Minerals (CENTRUM SILVER) tablet, Take 1 tablet by mouth daily. , Disp: , Rfl:  .  pantoprazole (PROTONIX) 40 MG tablet, TAKE 1 TABLET(40 MG) BY MOUTH DAILY BEFORE BREAKFAST, Disp:  90 tablet, Rfl: 0 .  sulfaSALAzine (AZULFIDINE) 500 MG tablet, TAKE 2 TABLETS BY MOUTH TWICE DAILY, Disp: 120 tablet, Rfl: 1  Past Medical History:  Diagnosis Date  . Colitis   . Crohn disease ([place])   . Depressive disorder, not elsewhere classified   . Personal history of peptic ulcer disease   . Vitamin B12 deficiency    Past Surgical History:  Procedure Laterality Date  . ABDOMINAL HYSTERECTOMY    . CESAREAN SECTION     x2  . COLONOSCOPY  09/11/08, 10/02/10  . FLEXIBLE SIGMOIDOSCOPY  10/04/12  . LASIK    . MYOMECTOMY    . RETINAL DETACHMENT SURGERY     left    Review of Systems As above in a very stressful time with her husband's death and transitioning health insurance etc. She is  traveling a lot and visiting children and grandchildren in [place] and I think [place]. Her 22 year old mother is still alive in [place] area and is doing as well as can be expected.  Objective:   Physical Exam BP 120/80 (BP Location: Left Arm, Patient Position: Sitting, Cuff Size: Normal)   Pulse 96   Ht 5\' 3"  (1.6 m)   Wt 143 lb 8 oz (65.1 kg)   BMI 25.42 kg/m  No acute distress Lungs clear Heart S1-S2 no rubs or gallops Abdomen is soft and nontender no argument or mass Eyes are anicteric Appropriate mood and affect

## 2016-10-10 NOTE — Progress Notes (Signed)
Labs all look ok I will also send to her PCP

## 2016-10-24 ENCOUNTER — Other Ambulatory Visit: Payer: Self-pay | Admitting: Internal Medicine

## 2016-12-22 ENCOUNTER — Other Ambulatory Visit: Payer: Self-pay | Admitting: Internal Medicine

## 2017-01-02 ENCOUNTER — Other Ambulatory Visit: Payer: Self-pay | Admitting: Internal Medicine

## 2017-01-02 NOTE — Telephone Encounter (Signed)
Refill x 1 year 

## 2017-01-02 NOTE — Telephone Encounter (Signed)
Seen in May , okay to refill Sir?

## 2017-04-12 ENCOUNTER — Other Ambulatory Visit: Payer: Self-pay | Admitting: Internal Medicine

## 2017-08-03 ENCOUNTER — Other Ambulatory Visit: Payer: Self-pay

## 2017-08-03 MED ORDER — PANTOPRAZOLE SODIUM 40 MG PO TBEC: DELAYED_RELEASE_TABLET | ORAL | 1 refills | Status: DC

## 2017-08-03 NOTE — Telephone Encounter (Signed)
Pantoprazole refilled as requested. 

## 2017-08-10 ENCOUNTER — Other Ambulatory Visit: Payer: Self-pay

## 2017-08-10 MED ORDER — FOLIC ACID 1 MG PO TABS: 1.0000 mg | ORAL_TABLET | Freq: Every day | ORAL | 0 refills | Status: DC

## 2017-08-10 NOTE — Telephone Encounter (Signed)
Folic acid 1mg  tablets refilled as requested to the [facility] in [place]

## 2017-08-31 ENCOUNTER — Other Ambulatory Visit: Payer: Self-pay

## 2017-08-31 MED ORDER — FOLIC ACID 1 MG PO TABS: 1.0000 mg | ORAL_TABLET | Freq: Every day | ORAL | 0 refills | Status: AC

## 2017-08-31 NOTE — Telephone Encounter (Signed)
Folic acid refill sent to [facility] in [facility] as requested.

## 2017-10-16 ENCOUNTER — Other Ambulatory Visit: Payer: Self-pay

## 2017-10-16 ENCOUNTER — Other Ambulatory Visit: Payer: Self-pay | Admitting: Emergency Medicine

## 2017-10-16 MED ORDER — SULFASALAZINE 500 MG PO TABS: 1000.0000 mg | ORAL_TABLET | Freq: Two times a day (BID) | ORAL | 0 refills | Status: AC

## 2017-10-16 NOTE — Telephone Encounter (Signed)
Opened in error. Rx already sent to pharmacy by another [facility].

## 2017-10-28 ENCOUNTER — Telehealth: Payer: Self-pay

## 2017-10-28 MED ORDER — PANTOPRAZOLE SODIUM 40 MG PO TBEC
DELAYED_RELEASE_TABLET | ORAL | 0 refills | Status: DC
Start: 2017-10-28 — End: 2018-09-29

## 2017-10-28 NOTE — Telephone Encounter (Signed)
We received a refill request from [facility] in [place], [place] for patients pantoprazole 40mg . I called her and she said she has permanently relocating  there. She is going to get a GI Doctor there and requested that we refill the pantoprazole to cover her until she can get in down there. I gave her the medical records # and fax # she will need to get her records transferred. Pantoprazole refilled.

## 2018-01-26 ENCOUNTER — Other Ambulatory Visit: Payer: Self-pay

## 2018-01-26 MED ORDER — BUPROPION HCL ER (XL) 150 MG PO TB24: 150.0000 mg | ORAL_TABLET | Freq: Every day | ORAL | 0 refills | Status: AC

## 2018-05-06 ENCOUNTER — Other Ambulatory Visit: Payer: Self-pay | Admitting: Internal Medicine

## 2018-09-29 ENCOUNTER — Other Ambulatory Visit: Payer: Self-pay | Admitting: Internal Medicine

## 2018-09-30 ENCOUNTER — Other Ambulatory Visit: Payer: Self-pay | Admitting: Internal Medicine

## 2019-01-26 ENCOUNTER — Other Ambulatory Visit: Payer: Self-pay | Admitting: Internal Medicine
